# Patient Record
Sex: Male | Born: 1950
Health system: Southern US, Community
[De-identification: ages and names within clinical notes are randomized; demographics above are authoritative.]

## PROBLEM LIST (undated history)

## (undated) DIAGNOSIS — G20A1 Parkinson's disease without dyskinesia, without mention of fluctuations: Secondary | ICD-10-CM

## (undated) DIAGNOSIS — E785 Hyperlipidemia, unspecified: Secondary | ICD-10-CM

## (undated) DIAGNOSIS — I10 Essential (primary) hypertension: Secondary | ICD-10-CM

## (undated) DIAGNOSIS — N529 Male erectile dysfunction, unspecified: Secondary | ICD-10-CM

## (undated) DIAGNOSIS — N433 Hydrocele, unspecified: Secondary | ICD-10-CM

---

## 1992-08-07 HISTORY — PX: VARICOCELECTOMY: SHX1084

## 1995-08-08 HISTORY — PX: HYDROCELE EXCISION: SHX482

## 2013-04-22 ENCOUNTER — Encounter: Payer: Self-pay | Admitting: Sports Medicine

## 2013-04-22 ENCOUNTER — Ambulatory Visit (INDEPENDENT_AMBULATORY_CARE_PROVIDER_SITE_OTHER): Payer: 59 | Admitting: Sports Medicine

## 2013-04-22 DIAGNOSIS — M25512 Pain in left shoulder: Secondary | ICD-10-CM

## 2013-04-22 DIAGNOSIS — M25519 Pain in unspecified shoulder: Secondary | ICD-10-CM | POA: Insufficient documentation

## 2013-04-22 NOTE — Progress Notes (Signed)
  Subjective:    Patient ID: Cristian Campos, male    DOB: 10/01/1950, 62 y.o.   MRN: [id number]  HPI Mr. Holdren is a 62 year old right hand dominant male who is coming in for left shoulder pain. This pain first started 4 years ago when he went digging with his son, but since then, it has been intermittently bothering him. He now notices it most when he plays golf, or holds something heavy in his left arm. He does not have pain that wakes him up at night. The pain is on the outside part of his upper arm where his deltoid muscle ends, and it only bothers him with certain movements. He has not had any decreased ROM. He takes 400 mg advil before golf, and then his shoulder rarely bothers him. He notices a sharp pain if he abducts and internally rotates his shoulder to hold something heavy. He only has pain with impact if he has a bad golf swing.   Solicitor and works with [employer]   Review of Systems     Objective:   Physical Exam Muscular male in NAD  No scapular winging or other deformity. Bilateral scapula are well developed and symmetric. Left shoulder: No tenderness to palpation along scapular spine, AC joint, along clavicle, or at biceps tendon insertion. Minimal pain with palpation of distal deltoid along anterior aspect. Full ROM with shoulder flexion/extension, abduction, IR/ER. 5/5 strength throughout, pain with internal rotation and abduction. Negative Neer's/Hawkins. Negative empty can. Negative liftoff. Negative speeds. Positive Yergason's test on left. Negative O'Briens.   Ultrasound: Scar tissue along left deltoid. Small amount of inflammation and calcification at biceps tendon. Hypoechoic change  On long and trans view of Biceps tendon.  Rotator cuff looks normal with the exception of a small cyst along the teres minor.       Assessment & Plan:   Left shoulder pain 2/2 to likely previous deltoid tear with resultant scar tissue  Plan: - Home exercises to  strengthen deltoid and biceps - Advil PRN - If no improvement, consider nitro patches

## 2013-04-22 NOTE — Patient Instructions (Addendum)
You are going to do some light deltoid work to improve your shoulder.  The deltoid helps to forward flex, abduct, and extend the shoulder back. It is important to work on all three functions.   Take a 5 lb weight, with arms at 90 degrees, abduct out. Next, forward flex the arm. Then extend the arms back. Do 3 sets of 15.  Remember reps are more important than weight.  Do 3 sets of 15 curls for biceps. Next, take forearm, and externally rotate (Arm rolls). Then, hold the arm out with your palm up, and come straight up.   Work on these for the next 1-2 months. You can work on the right side, but the left side is more important.   If your symptoms are not improved in 2 months, we will bring you back and scan it again. If needed, we can consider further treatment at that time.   You can keep taking advil as needed for pain.

## 2013-04-22 NOTE — Assessment & Plan Note (Signed)
I think we should start this with HEP  Deltoid rehab  Biceps rehab  Reck 2 mos  If not better use NTG

## 2014-05-20 ENCOUNTER — Encounter: Payer: Self-pay | Admitting: Sports Medicine

## 2014-05-20 ENCOUNTER — Ambulatory Visit (INDEPENDENT_AMBULATORY_CARE_PROVIDER_SITE_OTHER): Payer: 59 | Admitting: Sports Medicine

## 2014-05-20 VITALS — BP 132/75 | HR 65 | Ht 69.0 in | Wt 160.0 lb

## 2014-05-20 DIAGNOSIS — G5600 Carpal tunnel syndrome, unspecified upper limb: Secondary | ICD-10-CM | POA: Insufficient documentation

## 2014-05-20 DIAGNOSIS — G5601 Carpal tunnel syndrome, right upper limb: Secondary | ICD-10-CM

## 2014-05-20 MED ORDER — VITAMIN B-6 100 MG PO TABS: 100.0000 mg | ORAL_TABLET | Freq: Every day | ORAL | Status: DC

## 2014-05-20 NOTE — Assessment & Plan Note (Signed)
R nerve caliber 0.13cm2. Fluid around palmaris longus tendon. Thickened trans-palmar ligament. -Continue wrist splinting -Start pre-tx with two Aleve before biking. Make sure well padded glove and handle bars -Start Vitamin B6 100mg  po qd x 6 months -Start squeezing soft ball in warm water once nightly to mobilize palmaris longus tendon fluid. -Follow-up if symptoms persist or worsen.

## 2014-05-20 NOTE — Progress Notes (Signed)
   Subjective:    Patient ID: Cristian MoellerJames Campos, male    DOB: 08-Sep-1950, 63 y.o.   MRN: [id number]  HPI Cristian Campos is a 63 year old right-hand-dominant male who presents with right wrist pain. Onset was several months ago, without any known acute injury. He primarily states that the character of pain is numbness and tingling in the first 3 and sometimes 4 fingers of the right hand. He has tried wearing a wrist brace at night, which has relieved some of the symptoms. He says that the other night he did not have his wrist splint, and he woke up with increasing numbness in the right hand. He denies any elbow or neck pain. He denies any significant weakness associated with this.  Past medical history, social history, medications, and allergies were reviewed and are up to date in the chart.  Review of Systems 7 point review of systems was performed and was otherwise negative unless noted in the history of present illness.     Objective:   Physical Exam BP 132/75  Pulse 65  Ht 5\' 9"  (1.753 m)  Wt 160 lb (72.576 kg)  BMI 23.62 kg/m2 GEN: The patient is well-developed well-nourished male and in no acute distress.  He is awake alert and oriented x3. SKIN: warm and well-perfused, no rash  Neuro: Strength 5/5 globally. Sensation intact throughout. No focal deficits. Vasc: +2 bilateral distal pulses. No edema.  MSK: Full range of motion of bilateral wrists. Full pain-free cervical spine motion. Negative Spurling's. Negative Tinel's at the elbow. No swelling or overlying erythema. Positive Tinel's at the right wrist. Positive Phalen's at the right wrist. He has intact thumb abduction strength and intrinsic hand strength. There is no thenar wasting.  Limited musculoskeletal ultrasound: Long and short axis views were obtained of the right wrist. The left median nerve caliber is 0.09 cm and the right median nerve caliber is 0.13 cm. There appears to be thickening of the Trans-palmar ligament on the right.  There also appears to be a moderate fluid collection surrounding the right palmaris longus tendon.     Assessment & Plan:  Please see problem based assessment and plan in the problem list.

## 2015-04-09 ENCOUNTER — Other Ambulatory Visit: Payer: Self-pay | Admitting: Family Medicine

## 2015-04-09 DIAGNOSIS — N5089 Other specified disorders of the male genital organs: Secondary | ICD-10-CM

## 2015-04-15 ENCOUNTER — Ambulatory Visit
Admission: RE | Admit: 2015-04-15 | Discharge: 2015-04-15 | Disposition: A | Discharge status: Home / Self Care | Payer: 59 | Source: Ambulatory Visit | Attending: Family Medicine | Admitting: Family Medicine

## 2015-04-15 DIAGNOSIS — N5089 Other specified disorders of the male genital organs: Secondary | ICD-10-CM

## 2015-09-03 ENCOUNTER — Other Ambulatory Visit: Payer: Self-pay | Admitting: Urology

## 2015-11-16 ENCOUNTER — Encounter (HOSPITAL_BASED_OUTPATIENT_CLINIC_OR_DEPARTMENT_OTHER): Payer: Self-pay | Admitting: *Deleted

## 2015-11-16 NOTE — Progress Notes (Signed)
NPO AFTER MN.  ARRIVE AT 0600.  NEEDS ISTAT AND EKG.  WILL TAKE ATENOLOL AM DOS W/ SIPS OF WATER. 

## 2015-11-22 NOTE — Anesthesia Preprocedure Evaluation (Addendum)
Anesthesia Evaluation  Patient identified by MRN, date of birth, ID band Patient awake    Reviewed: Allergy & Precautions, NPO status , Patient's Chart, lab work & pertinent test results, reviewed documented beta blocker date and time   Airway Mallampati: I  TM Distance: >3 FB Neck ROM: Full    Dental  (+) Dental Advisory Given   Pulmonary neg pulmonary ROS,    breath sounds clear to auscultation       Cardiovascular hypertension, Pt. on medications and Pt. on home beta blockers  Rhythm:Regular Rate:Normal     Neuro/Psych negative neurological ROS     GI/Hepatic negative GI ROS, Neg liver ROS,   Endo/Other  negative endocrine ROS  Renal/GU negative Renal ROS     Musculoskeletal   Abdominal   Peds  Hematology negative hematology ROS (+)   Anesthesia Other Findings   Reproductive/Obstetrics                            Lab Results  Component Value Date   HGB 15.3 11/23/2015   HCT 45.0 11/23/2015   Lab Results  Component Value Date   NA 141 11/23/2015   K 3.7 11/23/2015    Anesthesia Physical Anesthesia Plan  ASA: II  Anesthesia Plan: General   Post-op Pain Management:    Induction: Intravenous  Airway Management Planned: LMA  Additional Equipment:   Intra-op Plan:   Post-operative Plan: Extubation in OR  Informed Consent: I have reviewed the patients History and Physical, chart, labs and discussed the procedure including the risks, benefits and alternatives for the proposed anesthesia with the patient or authorized representative who has indicated his/her understanding and acceptance.     Plan Discussed with: CRNA  Anesthesia Plan Comments:        Anesthesia Quick Evaluation

## 2015-11-23 ENCOUNTER — Encounter (HOSPITAL_BASED_OUTPATIENT_CLINIC_OR_DEPARTMENT_OTHER): Payer: Self-pay | Admitting: *Deleted

## 2015-11-23 ENCOUNTER — Ambulatory Visit (HOSPITAL_BASED_OUTPATIENT_CLINIC_OR_DEPARTMENT_OTHER)
Admission: RE | Admit: 2015-11-23 | Discharge: 2015-11-23 | Disposition: A | Discharge status: Home / Self Care | Payer: 59 | Source: Ambulatory Visit | Attending: Urology | Admitting: Urology

## 2015-11-23 ENCOUNTER — Encounter (HOSPITAL_BASED_OUTPATIENT_CLINIC_OR_DEPARTMENT_OTHER)
Admission: RE | Disposition: A | Discharge status: Home / Self Care | Payer: Self-pay | Source: Ambulatory Visit | Attending: Urology

## 2015-11-23 ENCOUNTER — Ambulatory Visit (HOSPITAL_BASED_OUTPATIENT_CLINIC_OR_DEPARTMENT_OTHER): Payer: 59 | Admitting: Anesthesiology

## 2015-11-23 DIAGNOSIS — L723 Sebaceous cyst: Secondary | ICD-10-CM | POA: Insufficient documentation

## 2015-11-23 DIAGNOSIS — N433 Hydrocele, unspecified: Secondary | ICD-10-CM | POA: Diagnosis not present

## 2015-11-23 DIAGNOSIS — Z7982 Long term (current) use of aspirin: Secondary | ICD-10-CM | POA: Diagnosis not present

## 2015-11-23 DIAGNOSIS — Z79899 Other long term (current) drug therapy: Secondary | ICD-10-CM | POA: Insufficient documentation

## 2015-11-23 DIAGNOSIS — I10 Essential (primary) hypertension: Secondary | ICD-10-CM | POA: Insufficient documentation

## 2015-11-23 DIAGNOSIS — E785 Hyperlipidemia, unspecified: Secondary | ICD-10-CM | POA: Diagnosis not present

## 2015-11-23 HISTORY — DX: Male erectile dysfunction, unspecified: N52.9

## 2015-11-23 HISTORY — PX: HYDROCELE EXCISION: SHX482

## 2015-11-23 HISTORY — DX: Hyperlipidemia, unspecified: E78.5

## 2015-11-23 HISTORY — DX: Hydrocele, unspecified: N43.3

## 2015-11-23 HISTORY — DX: Essential (primary) hypertension: I10

## 2015-11-23 LAB — POCT I-STAT 4, (NA,K, GLUC, HGB,HCT)
GLUCOSE: 102 mg/dL — AB (ref 65–99)
HEMATOCRIT: 45 % (ref 39.0–52.0)
Hemoglobin: 15.3 g/dL (ref 13.0–17.0)
POTASSIUM: 3.7 mmol/L (ref 3.5–5.1)
SODIUM: 141 mmol/L (ref 135–145)

## 2015-11-23 SURGERY — HYDROCELECTOMY
Anesthesia: General | Site: Scrotum | Laterality: Right

## 2015-11-23 MED ORDER — PROPOFOL 10 MG/ML IV BOLUS
INTRAVENOUS | Status: DC | PRN
  Administered 2015-11-23: 200 mg via INTRAVENOUS

## 2015-11-23 MED ORDER — DEXAMETHASONE SODIUM PHOSPHATE 4 MG/ML IJ SOLN
INTRAMUSCULAR | Status: DC | PRN
  Administered 2015-11-23: 10 mg via INTRAVENOUS

## 2015-11-23 MED ORDER — PROPOFOL 10 MG/ML IV BOLUS
INTRAVENOUS | Status: AC
  Filled 2015-11-23: qty 40

## 2015-11-23 MED ORDER — CEFAZOLIN SODIUM-DEXTROSE 2-4 GM/100ML-% IV SOLN
2.0000 g | INTRAVENOUS | Status: AC
  Administered 2015-11-23: 2 g via INTRAVENOUS
  Filled 2015-11-23: qty 100

## 2015-11-23 MED ORDER — LIDOCAINE HCL (CARDIAC) 20 MG/ML IV SOLN
INTRAVENOUS | Status: AC
  Filled 2015-11-23: qty 5

## 2015-11-23 MED ORDER — DEXAMETHASONE SODIUM PHOSPHATE 10 MG/ML IJ SOLN
INTRAMUSCULAR | Status: AC
  Filled 2015-11-23: qty 1

## 2015-11-23 MED ORDER — KETOROLAC TROMETHAMINE 30 MG/ML IJ SOLN
30.0000 mg | Freq: Once | INTRAMUSCULAR | Status: DC
  Filled 2015-11-23: qty 1

## 2015-11-23 MED ORDER — MIDAZOLAM HCL 2 MG/2ML IJ SOLN
INTRAMUSCULAR | Status: AC
  Filled 2015-11-23: qty 2

## 2015-11-23 MED ORDER — HYDROCODONE-ACETAMINOPHEN 7.5-325 MG PO TABS
1.0000 | ORAL_TABLET | Freq: Once | ORAL | Status: AC | PRN
  Administered 2015-11-23: 1 via ORAL
  Filled 2015-11-23: qty 1

## 2015-11-23 MED ORDER — PROMETHAZINE HCL 25 MG/ML IJ SOLN
6.2500 mg | INTRAMUSCULAR | Status: DC | PRN
Start: 2015-11-23 — End: 2015-11-23
  Filled 2015-11-23: qty 1

## 2015-11-23 MED ORDER — KETOROLAC TROMETHAMINE 30 MG/ML IJ SOLN
INTRAMUSCULAR | Status: AC
  Filled 2015-11-23: qty 1

## 2015-11-23 MED ORDER — LIDOCAINE HCL (CARDIAC) 20 MG/ML IV SOLN
INTRAVENOUS | Status: DC | PRN
  Administered 2015-11-23: 80 mg via INTRAVENOUS

## 2015-11-23 MED ORDER — ONDANSETRON HCL 4 MG/2ML IJ SOLN
INTRAMUSCULAR | Status: DC | PRN
  Administered 2015-11-23: 4 mg via INTRAVENOUS

## 2015-11-23 MED ORDER — ONDANSETRON HCL 4 MG/2ML IJ SOLN
INTRAMUSCULAR | Status: AC
  Filled 2015-11-23: qty 2

## 2015-11-23 MED ORDER — FENTANYL CITRATE (PF) 100 MCG/2ML IJ SOLN
INTRAMUSCULAR | Status: DC | PRN
  Administered 2015-11-23: 50 ug via INTRAVENOUS

## 2015-11-23 MED ORDER — LIDOCAINE-EPINEPHRINE (PF) 1 %-1:200000 IJ SOLN
INTRAMUSCULAR | Status: DC | PRN
  Administered 2015-11-23: 2.5 mL

## 2015-11-23 MED ORDER — CEFAZOLIN SODIUM-DEXTROSE 2-4 GM/100ML-% IV SOLN
INTRAVENOUS | Status: AC
  Filled 2015-11-23: qty 100

## 2015-11-23 MED ORDER — MIDAZOLAM HCL 5 MG/5ML IJ SOLN
INTRAMUSCULAR | Status: DC | PRN
  Administered 2015-11-23: 2 mg via INTRAVENOUS

## 2015-11-23 MED ORDER — HYDROCODONE-ACETAMINOPHEN 7.5-325 MG PO TABS
ORAL_TABLET | ORAL | Status: AC
  Filled 2015-11-23: qty 1

## 2015-11-23 MED ORDER — HYDROMORPHONE HCL 1 MG/ML IJ SOLN
0.2500 mg | INTRAMUSCULAR | Status: DC | PRN
  Filled 2015-11-23: qty 1

## 2015-11-23 MED ORDER — FENTANYL CITRATE (PF) 100 MCG/2ML IJ SOLN
INTRAMUSCULAR | Status: AC
  Filled 2015-11-23: qty 2

## 2015-11-23 MED ORDER — DEXTROSE 5 % IV SOLN
1.0000 g | INTRAVENOUS | Status: DC
  Filled 2015-11-23: qty 10

## 2015-11-23 MED ORDER — OXYCODONE-ACETAMINOPHEN 5-325 MG PO TABS: 1.0000 | ORAL_TABLET | Freq: Four times a day (QID) | ORAL | Status: DC | PRN

## 2015-11-23 MED ORDER — BUPIVACAINE-EPINEPHRINE 0.5% -1:200000 IJ SOLN
INTRAMUSCULAR | Status: DC | PRN
  Administered 2015-11-23: 2.5 mL

## 2015-11-23 MED ORDER — LACTATED RINGERS IV SOLN
INTRAVENOUS | Status: DC
  Administered 2015-11-23: 07:00:00 via INTRAVENOUS
  Filled 2015-11-23: qty 1000

## 2015-11-23 SURGICAL SUPPLY — 42 items
BLADE SURG 15 STRL LF DISP TIS (BLADE) ×1 IMPLANT
BLADE SURG 15 STRL SS (BLADE) ×2
BNDG GAUZE ELAST 4 BULKY (GAUZE/BANDAGES/DRESSINGS) ×3 IMPLANT
COVER BACK TABLE 60X90IN (DRAPES) ×3 IMPLANT
COVER MAYO STAND STRL (DRAPES) ×3 IMPLANT
DRAIN PENROSE 18X1/4 LTX STRL (WOUND CARE) IMPLANT
DRAPE LAPAROTOMY 100X72 PEDS (DRAPES) ×3 IMPLANT
DRSG TELFA 3X8 NADH (GAUZE/BANDAGES/DRESSINGS) ×3 IMPLANT
ELECT REM PT RETURN 9FT ADLT (ELECTROSURGICAL) ×3
ELECTRODE REM PT RTRN 9FT ADLT (ELECTROSURGICAL) ×1 IMPLANT
GAUZE SPONGE 4X4 16PLY XRAY LF (GAUZE/BANDAGES/DRESSINGS) ×3 IMPLANT
GLOVE BIOGEL M STRL SZ7.5 (GLOVE) ×3 IMPLANT
GLOVE BIOGEL PI IND STRL 7.0 (GLOVE) ×1 IMPLANT
GLOVE BIOGEL PI IND STRL 7.5 (GLOVE) ×2 IMPLANT
GLOVE BIOGEL PI INDICATOR 7.0 (GLOVE) ×2
GLOVE BIOGEL PI INDICATOR 7.5 (GLOVE) ×4
GLOVE SURG SS PI 7.5 STRL IVOR (GLOVE) ×9 IMPLANT
GOWN STRL REUS W/ TWL XL LVL3 (GOWN DISPOSABLE) ×2 IMPLANT
GOWN STRL REUS W/TWL XL LVL3 (GOWN DISPOSABLE) ×4
KIT ROOM TURNOVER WOR (KITS) ×3 IMPLANT
MANIFOLD NEPTUNE II (INSTRUMENTS) IMPLANT
NEEDLE HYPO 22GX1.5 SAFETY (NEEDLE) ×3 IMPLANT
NS IRRIG 500ML POUR BTL (IV SOLUTION) ×3 IMPLANT
PACK BASIN DAY SURGERY FS (CUSTOM PROCEDURE TRAY) ×3 IMPLANT
PENCIL BUTTON HOLSTER BLD 10FT (ELECTRODE) ×3 IMPLANT
SPONGE LAP 4X18 X RAY DECT (DISPOSABLE) IMPLANT
SUPPORT SCROTAL LG STRP (MISCELLANEOUS) IMPLANT
SUPPORT SCROTAL MED ADLT STRP (MISCELLANEOUS) IMPLANT
SUPPORTER ATHLETIC LG (MISCELLANEOUS)
SUPPORTER ATHLETIC MED (MISCELLANEOUS)
SUT CHROMIC 3 0 PS 2 (SUTURE) ×3 IMPLANT
SUT CHROMIC 3 0 SH 27 (SUTURE) IMPLANT
SUT PDS AB 4-0 RB1 27 (SUTURE) IMPLANT
SUT VIC AB 2-0 SH 27 (SUTURE) ×4
SUT VIC AB 2-0 SH 27XBRD (SUTURE) ×2 IMPLANT
SYR BULB IRRIGATION 50ML (SYRINGE) ×3 IMPLANT
SYR CONTROL 10ML LL (SYRINGE) ×3 IMPLANT
TRAY DSU PREP LF (CUSTOM PROCEDURE TRAY) ×3 IMPLANT
TUBE CONNECTING 12'X1/4 (SUCTIONS) ×1
TUBE CONNECTING 12X1/4 (SUCTIONS) ×2 IMPLANT
WATER STERILE IRR 500ML POUR (IV SOLUTION) IMPLANT
YANKAUER SUCT BULB TIP NO VENT (SUCTIONS) ×3 IMPLANT

## 2015-11-23 NOTE — Transfer of Care (Signed)
Immediate Anesthesia Transfer of Care Note  Patient: Cristian MoellerJames Delamar  Procedure(s) Performed: Procedure(s): HYDROCELECTOMY ADULT, EXCISION OF SEBACIOUS CYST FROM RIGHT SCROTUM (Right)  Patient Location: PACU  Anesthesia Type:General  Level of Consciousness: sedated and responds to stimulation  Airway & Oxygen Therapy: Patient Spontanous Breathing and Patient connected to nasal cannula oxygen  Post-op Assessment: Report given to RN  Post vital signs: Reviewed and stable  Last Vitals:110/79, 57, 13, 98% Filed Vitals:   11/23/15 0609  BP: 125/74  Pulse: 57  Temp: 36.5 C  Resp: 16    Complications: No apparent anesthesia complications

## 2015-11-23 NOTE — Anesthesia Procedure Notes (Signed)
Procedure Name: LMA Insertion Date/Time: 11/23/2015 7:40 AM Performed by: Maris BergerENENNY, Elvie Palomo T Pre-anesthesia Checklist: Patient identified, Emergency Drugs available, Suction available and Patient being monitored Patient Re-evaluated:Patient Re-evaluated prior to inductionOxygen Delivery Method: Circle System Utilized Preoxygenation: Pre-oxygenation with 100% oxygen Intubation Type: IV induction Ventilation: Mask ventilation without difficulty LMA: LMA inserted LMA Size: 5.0 Number of attempts: 1 Airway Equipment and Method: Bite block Placement Confirmation: positive ETCO2 Dental Injury: Teeth and Oropharynx as per pre-operative assessment

## 2015-11-23 NOTE — H&P (Signed)
H&P  Chief Complaint: right hydrocele  History of Present Illness: 65 yo with symptomatic right hydrocele. He presents for right hydrocelectomy. He has been well. No complaints.   Past Medical History  Diagnosis Date  . Hypertension   . Hyperlipidemia   . ED (erectile dysfunction)   . Right hydrocele    Past Surgical History  Procedure Laterality Date  . Varicocelectomy  1994  . Hydrocele excision Left 1997    Home Medications:  Prescriptions prior to admission  Medication Sig Dispense Refill Last Dose  . aspirin EC 81 MG tablet Take 81 mg by mouth daily.   11/12/2015  . atenolol (TENORMIN) 50 MG tablet Take 25 mg by mouth every morning.    11/23/2015 at 0520  . Multiple Vitamin (MULTIVITAMIN) tablet Take 1 tablet by mouth daily.     . Omega-3 Fatty Acids (FISH OIL) 1000 MG CAPS Take 1 capsule by mouth 2 (two) times daily.   11/09/2015  . simvastatin (ZOCOR) 40 MG tablet Take 0.25 tablets by mouth every evening.    11/22/2015 at Unknown time  . triamterene-hydrochlorothiazide (MAXZIDE-25) 37.5-25 MG per tablet Take 1 tablet by mouth every morning.    11/22/2015 at Unknown time  . VIAGRA 100 MG tablet Take 100 mg by mouth as needed.    Past Week at Unknown time   Allergies: No Known Allergies  History reviewed. No pertinent family history. Social History:  reports that he has never smoked. He has never used smokeless tobacco. He reports that he drinks alcohol. He reports that he does not use illicit drugs.  ROS: A complete review of systems was performed.  All systems are negative except for pertinent findings as noted. Review of Systems  All other systems reviewed and are negative.    Physical Exam:  Vital signs in last 24 hours: Temp:  [97.7 F (36.5 C)] 97.7 F (36.5 C) (04/18 0609) Pulse Rate:  [57] 57 (04/18 0609) Resp:  [16] 16 (04/18 0609) BP: (125)/(74) 125/74 mmHg (04/18 0609) SpO2:  [100 %] 100 % (04/18 0609) Weight:  [73.483 kg (162 lb)] 73.483 kg (162 lb)  (04/18 40980609) General:  Alert and oriented, No acute distress HEENT: Normocephalic, atraumatic Lungs: Regular rate and effort Extremities: No edema Neurologic: Grossly intact  Laboratory Data:  Results for orders placed or performed during the hospital encounter of 11/23/15 (from the past 24 hour(s))  I-STAT 4, (NA,K, GLUC, HGB,HCT)     Status: Abnormal   Collection Time: 11/23/15  6:36 AM  Result Value Ref Range   Sodium 141 135 - 145 mmol/L   Potassium 3.7 3.5 - 5.1 mmol/L   Glucose, Bld 102 (H) 65 - 99 mg/dL   HCT 11.945.0 14.739.0 - 82.952.0 %   Hemoglobin 15.3 13.0 - 17.0 g/dL   No results found for this or any previous visit (from the past 240 hour(s)). Creatinine: No results for input(s): CREATININE in the last 168 hours.  Impression/Assessment:  Right hydrocelectomy  Plan:  I discussed with the patient the nature, potential benefits, risks and alternatives to right hydrocelectomy, including side effects of the proposed treatment, the likelihood of the patient achieving the goals of the procedure, and any potential problems that might occur during the procedure or recuperation. All questions answered. Patient elects to proceed.    Cristian Campos 11/23/2015, 7:33 AM

## 2015-11-23 NOTE — Discharge Instructions (Signed)
Hydrocelectomy, Care After Refer to this sheet in the next few weeks. These instructions provide you with information about caring for yourself after your procedure. Your health care provider may also give you more specific instructions. Your treatment has been planned according to current medical practices, but problems sometimes occur. Call your health care provider if you have any problems or questions after your procedure. WHAT TO EXPECT AFTER THE PROCEDURE After your procedure, it is common for the pouch that holds your testicles (scrotum) to be painful, swollen, and bruised. HOME CARE INSTRUCTIONS Bathing  Ask your health care provider when you can shower, take baths, or go swimming.  If you were told to wear an athletic support strap, take it off when you shower or take a bath. Incision Care  Follow instructions from your health care provider about how to take care of your incision. Make sure you:  Wash your hands with soap and water before you change your bandage (dressing). If soap and water are not available, use hand sanitizer.  Change your dressing as told by your health care provider.  Leave stitches (sutures) in place.  Check your incision and scrotum every day for signs of infection. Check for:  More redness, swelling, or pain.  Blood or fluid.  Warmth.  Pus or a bad smell. Managing Pain, Stiffness, and Swelling  If directed, apply ice to the injured area:  Put ice in a plastic bag.  Place a towel between your skin and the bag.  Leave the ice on for 20 minutes, 2-3 times per day. Driving  Do not drive for 24 hours if you received a sedative.  Do not drive or operate heavy machinery while taking prescription pain medicine.  Ask your health care provider when it is safe to drive. Activity  Do not do any activities that require great strength and energy (are vigorous) for as long as told by your health care provider.  Return to your normal activities as  told by your health care provider. Ask your health care provider what activities are safe for you.  Do not lift anything that is heavier than 10 lb (4.5 kg) until your health care provider says that it is safe. General Instructions  Take over-the-counter and prescription medicines only as told by your health care provider.  Keep all follow-up visits as told by your health care provider. This is important.  If you were given an athletic support strap, wear it as told by your health care provider.  If you had a drain put in during the procedure, you will need to return to have it removed. SEEK MEDICAL CARE IF:  Your pain gets worse.  You have more redness, swelling, or pain around your scrotum.  You have blood or fluid coming from your scrotum.  Your incision feels warm to the touch.  You have pus or a bad smell coming from your scrotum.  You have a fever.   This information is not intended to replace advice given to you by your health care provider. Make sure you discuss any questions you have with your health care provider.   Document Released: 04/14/2015 Document Reviewed: 01/20/2015 Elsevier Interactive Patient Education 2016 [employer] Inc[employer]     Incision Care  An incision (cut) is when a surgeon cuts into your body. After surgery, the cut needs to be well cared for to keep it from getting infected.  HOW TO CARE FOR YOUR CUT  Do not take baths, swim, or use a hot  tub until your doctor says it is okay. You may shower as told by your doctor.  ICE area 30 minutes on and 30 minutes off throughout the rest of the day.  Return to your normal diet and activities as allowed by your doctor.  Use medicine that helps lessen itching on your cut as told by your doctor. Do not pick or scratch at your cut.  Drink enough fluids to keep your pee (urine) clear or pale yellow. GET HELP IF:  You have redness, puffiness (swelling), or pain at the site of your cut.  You have fluid,  blood, or pus coming from your cut.  Your muscles ache.  You have chills or you feel sick.  You have a bad smell coming from the cut or bandage.  Your cut opens up after stitches, staples, or adhesive strips have been removed.  You keep feeling sick to your stomach (nauseous) or keep throwing up (vomiting).  You have a fever.  You are dizzy. GET HELP RIGHT AWAY IF:  You have a rash.  You pass out (faint).  You have trouble breathing. MAKE SURE YOU:   Understand these instructions.  Will watch your condition.  Will get help right away if you are not doing well or get worse.   This information is not intended to replace advice given to you by your health care provider. Make sure you discuss any questions you have with your health care provider.   Document Released: 10/16/2011 Document Revised: 08/14/2014 Document Reviewed: 09/17/2013 Elsevier Interactive Patient Education 2016 [employer]      Post Anesthesia Home Care Instructions  Activity: Get plenty of rest for the remainder of the day. A responsible adult should stay with you for 24 hours following the procedure.  For the next 24 hours, DO NOT: -Drive a car -Advertising copywriterperate machinery -Drink alcoholic beverages -Take any medication unless instructed by your physician -Make any legal decisions or sign important papers.  Meals: Start with liquid foods such as gelatin or soup. Progress to regular foods as tolerated. Avoid greasy, spicy, heavy foods. If nausea and/or vomiting occur, drink only clear liquids until the nausea and/or vomiting subsides. Call your physician if vomiting continues.  Special Instructions/Symptoms: Your throat may feel dry or sore from the anesthesia or the breathing tube placed in your throat during surgery. If this causes discomfort, gargle with warm salt water. The discomfort should disappear within 24 hours.  If you had a scopolamine patch placed behind your ear for the management of post-  operative nausea and/or vomiting:  1. The medication in the patch is effective for 72 hours, after which it should be removed.  Wrap patch in a tissue and discard in the trash. Wash hands thoroughly with soap and water. 2. You may remove the patch earlier than 72 hours if you experience unpleasant side effects which may include dry mouth, dizziness or visual disturbances. 3. Avoid touching the patch. Wash your hands with soap and water after contact with the patch.

## 2015-11-23 NOTE — Anesthesia Postprocedure Evaluation (Signed)
Anesthesia Post Note  Patient: Marvis MoellerJames Boster  Procedure(s) Performed: Procedure(s) (LRB): HYDROCELECTOMY ADULT, EXCISION OF SEBACIOUS CYST FROM RIGHT SCROTUM (Right)  Patient location during evaluation: PACU Anesthesia Type: General Level of consciousness: awake and alert Pain management: pain level controlled Vital Signs Assessment: post-procedure vital signs reviewed and stable Respiratory status: spontaneous breathing, nonlabored ventilation, respiratory function stable and patient connected to nasal cannula oxygen Cardiovascular status: blood pressure returned to baseline and stable Postop Assessment: no signs of nausea or vomiting Anesthetic complications: no    Last Vitals:  Filed Vitals:   11/23/15 0935 11/23/15 1022  BP: 112/79 120/76  Pulse: 53 52  Temp:  36.4 C  Resp: 15 14    Last Pain:  Filed Vitals:   11/23/15 1024  PainSc: 4                  Kennieth RadFitzgerald, Marlyne Totaro E

## 2015-11-23 NOTE — Op Note (Signed)
Preoperative diagnosis: Right hydrocele Postoperative diagnosis: Right hydrocele, right scrotal sebaceous cyst   Procedure: Right hydrocelectomy, excision of right scrotal sebaceous cyst  Surgeon: Mena GoesEskridge  Anesthesia: Gen.  Indication for procedure: 65 year old with symptomatic right hydrocele.  Findings: 250 mL right hydrocele. Straw-colored fluid. Testicle and epididymis appeared normal. Palpably normal. 8mm right sebaceous cyst about 2 cm inferior and lateral to the edge of the incision which had grown out into a skin tag. Palpably there was a mobile smooth nodule that appeared white under the skin.   Description of procedure: After consent was obtained patient brought to the operating room. After adequate anesthesia the genitalia were prepped and draped in the usual sterile fashion. A timeout was performed to confirm the patient and procedure. Local was injected into the skin and the hemiscrotal incision was made. The darkest fascia was divided and separated from the hydrocele sac quite easily. The tissue planes were pristine. The hydrocele and testicle were then delivered through the incision and the hydrocele sac open. 250 mL of clear straw-colored fluid was drained. The sac was opened superiorly and inferiorly. The sac was noncommunicating. Excess sac was excised on the left side in the right side and it was everted and sewn to itself loosely around the cord with a running 2-0 Vicryl suture. Hemostasis was insured. The testicle was irrigated. It was placed back in the right hemiscrotum without torsion. The dartos fascia was run closed with a 2-0 Vicryl suture and the skin with a running horizontal mattress suture of 3-0 chromic. This sebaceous cyst was grasped in the skin tag and elevated and a scalpel was used to excise it. This left a very small defect in the scrotal skin which is closed with 1 interrupted chromic suture. A Telfa fluffs and athletic supporter replaced after the patient was  cleaned. He was then awakened and taken to recovery room in stable condition.  Complications: None Blood loss: 3 mL Specimens: None Drains: None

## 2015-11-24 ENCOUNTER — Encounter (HOSPITAL_BASED_OUTPATIENT_CLINIC_OR_DEPARTMENT_OTHER): Payer: Self-pay | Admitting: Urology

## 2017-08-23 DIAGNOSIS — H25813 Combined forms of age-related cataract, bilateral: Secondary | ICD-10-CM | POA: Diagnosis not present

## 2018-02-20 DIAGNOSIS — I10 Essential (primary) hypertension: Secondary | ICD-10-CM | POA: Diagnosis not present

## 2018-02-20 DIAGNOSIS — Z125 Encounter for screening for malignant neoplasm of prostate: Secondary | ICD-10-CM | POA: Diagnosis not present

## 2018-02-20 DIAGNOSIS — Z0000 Encounter for general adult medical examination without abnormal findings: Secondary | ICD-10-CM | POA: Diagnosis not present

## 2018-02-20 DIAGNOSIS — E782 Mixed hyperlipidemia: Secondary | ICD-10-CM | POA: Diagnosis not present

## 2018-03-01 DIAGNOSIS — R7301 Impaired fasting glucose: Secondary | ICD-10-CM | POA: Diagnosis not present

## 2018-06-14 DIAGNOSIS — Z23 Encounter for immunization: Secondary | ICD-10-CM | POA: Diagnosis not present

## 2018-08-21 DIAGNOSIS — H25813 Combined forms of age-related cataract, bilateral: Secondary | ICD-10-CM | POA: Diagnosis not present

## 2018-09-17 DIAGNOSIS — Z85828 Personal history of other malignant neoplasm of skin: Secondary | ICD-10-CM | POA: Diagnosis not present

## 2018-09-17 DIAGNOSIS — C44519 Basal cell carcinoma of skin of other part of trunk: Secondary | ICD-10-CM | POA: Diagnosis not present

## 2018-09-17 DIAGNOSIS — L821 Other seborrheic keratosis: Secondary | ICD-10-CM | POA: Diagnosis not present

## 2018-09-17 DIAGNOSIS — D225 Melanocytic nevi of trunk: Secondary | ICD-10-CM | POA: Diagnosis not present

## 2018-09-17 DIAGNOSIS — C44619 Basal cell carcinoma of skin of left upper limb, including shoulder: Secondary | ICD-10-CM | POA: Diagnosis not present

## 2018-09-17 DIAGNOSIS — D485 Neoplasm of uncertain behavior of skin: Secondary | ICD-10-CM | POA: Diagnosis not present

## 2019-01-03 DIAGNOSIS — E049 Nontoxic goiter, unspecified: Secondary | ICD-10-CM | POA: Diagnosis not present

## 2019-01-06 ENCOUNTER — Other Ambulatory Visit: Payer: Self-pay | Admitting: Family Medicine

## 2019-01-06 DIAGNOSIS — E049 Nontoxic goiter, unspecified: Secondary | ICD-10-CM

## 2019-01-13 ENCOUNTER — Other Ambulatory Visit: Payer: Self-pay

## 2019-01-13 ENCOUNTER — Ambulatory Visit
Admission: RE | Admit: 2019-01-13 | Discharge: 2019-01-13 | Disposition: A | Discharge status: Home / Self Care | Payer: Self-pay | Source: Ambulatory Visit | Attending: Family Medicine | Admitting: Family Medicine

## 2019-01-13 DIAGNOSIS — Z8639 Personal history of other endocrine, nutritional and metabolic disease: Secondary | ICD-10-CM | POA: Diagnosis not present

## 2019-01-13 DIAGNOSIS — E049 Nontoxic goiter, unspecified: Secondary | ICD-10-CM

## 2019-02-26 DIAGNOSIS — I10 Essential (primary) hypertension: Secondary | ICD-10-CM | POA: Diagnosis not present

## 2019-02-26 DIAGNOSIS — Z125 Encounter for screening for malignant neoplasm of prostate: Secondary | ICD-10-CM | POA: Diagnosis not present

## 2019-02-26 DIAGNOSIS — E782 Mixed hyperlipidemia: Secondary | ICD-10-CM | POA: Diagnosis not present

## 2019-02-28 DIAGNOSIS — Z0000 Encounter for general adult medical examination without abnormal findings: Secondary | ICD-10-CM | POA: Diagnosis not present

## 2019-06-24 DIAGNOSIS — M545 Low back pain: Secondary | ICD-10-CM | POA: Diagnosis not present

## 2019-06-24 DIAGNOSIS — M25532 Pain in left wrist: Secondary | ICD-10-CM | POA: Diagnosis not present

## 2019-07-21 DIAGNOSIS — M545 Low back pain: Secondary | ICD-10-CM | POA: Diagnosis not present

## 2019-07-21 DIAGNOSIS — M25532 Pain in left wrist: Secondary | ICD-10-CM | POA: Diagnosis not present

## 2019-07-28 ENCOUNTER — Other Ambulatory Visit: Payer: BC Managed Care – PPO

## 2019-08-11 DIAGNOSIS — Z20828 Contact with and (suspected) exposure to other viral communicable diseases: Secondary | ICD-10-CM | POA: Diagnosis not present

## 2019-09-02 ENCOUNTER — Ambulatory Visit: Payer: BC Managed Care – PPO

## 2019-09-11 ENCOUNTER — Ambulatory Visit: Payer: BC Managed Care – PPO | Attending: Internal Medicine

## 2019-09-15 ENCOUNTER — Ambulatory Visit: Payer: BC Managed Care – PPO | Attending: Internal Medicine

## 2019-09-15 DIAGNOSIS — Z23 Encounter for immunization: Secondary | ICD-10-CM | POA: Insufficient documentation

## 2019-09-15 NOTE — Progress Notes (Signed)
   Covid-19 Vaccination Clinic  Name:  Cristian Campos    MRN: [id number] DOB: [birth year]  09/15/2019  [name] was observed post Covid-19 immunization for 15 minutes without incidence. He was provided with Vaccine Information Sheet and instruction to access the V-Safe system.   [name] was instructed to call 911 with any severe reactions post vaccine: Marland Kitchen Difficulty breathing  . Swelling of your face and throat  . A fast heartbeat  . A bad rash all over your body  . Dizziness and weakness    Immunizations Administered    Name Date Dose VIS Date Route   Pfizer COVID-19 Vaccine 09/15/2019 10:58 AM 0.3 mL 07/18/2019 Intramuscular   Manufacturer: [employer], [employer]   Lot: [id number]   NDC: [address]-4

## 2019-09-19 ENCOUNTER — Ambulatory Visit: Payer: BC Managed Care – PPO

## 2019-09-29 DIAGNOSIS — C44612 Basal cell carcinoma of skin of right upper limb, including shoulder: Secondary | ICD-10-CM | POA: Diagnosis not present

## 2019-09-29 DIAGNOSIS — Z85828 Personal history of other malignant neoplasm of skin: Secondary | ICD-10-CM | POA: Diagnosis not present

## 2019-09-29 DIAGNOSIS — D485 Neoplasm of uncertain behavior of skin: Secondary | ICD-10-CM | POA: Diagnosis not present

## 2019-09-29 DIAGNOSIS — L570 Actinic keratosis: Secondary | ICD-10-CM | POA: Diagnosis not present

## 2019-09-29 DIAGNOSIS — L821 Other seborrheic keratosis: Secondary | ICD-10-CM | POA: Diagnosis not present

## 2019-10-08 DIAGNOSIS — H2513 Age-related nuclear cataract, bilateral: Secondary | ICD-10-CM | POA: Diagnosis not present

## 2019-10-09 ENCOUNTER — Ambulatory Visit: Payer: BC Managed Care – PPO | Attending: Internal Medicine

## 2019-10-09 DIAGNOSIS — Z23 Encounter for immunization: Secondary | ICD-10-CM | POA: Insufficient documentation

## 2019-10-09 NOTE — Progress Notes (Signed)
   Covid-19 Vaccination Clinic  Name:  Cristian Campos    MRN: [id number] DOB: February 27, 1951  10/09/2019  Cristian Campos was observed post Covid-19 immunization for 15 minutes without incident. He was provided with Vaccine Information Sheet and instruction to access the V-Safe system.   Cristian Campos was instructed to call 911 with any severe reactions post vaccine: Marland Kitchen Difficulty breathing  . Swelling of face and throat  . A fast heartbeat  . A bad rash all over body  . Dizziness and weakness   Immunizations Administered    Name Date Dose VIS Date Route   Pfizer COVID-19 Vaccine 10/09/2019  5:55 PM 0.3 mL 07/18/2019 Intramuscular   Manufacturer: [employer], [employer]   Lot: [id number]   NDC: [address]-6

## 2019-12-01 ENCOUNTER — Encounter: Payer: Self-pay | Admitting: Podiatrist

## 2019-12-01 ENCOUNTER — Other Ambulatory Visit: Payer: Self-pay

## 2019-12-01 ENCOUNTER — Ambulatory Visit (INDEPENDENT_AMBULATORY_CARE_PROVIDER_SITE_OTHER): Payer: BC Managed Care – PPO | Admitting: Podiatrist

## 2019-12-01 ENCOUNTER — Ambulatory Visit (INDEPENDENT_AMBULATORY_CARE_PROVIDER_SITE_OTHER): Payer: BC Managed Care – PPO

## 2019-12-01 ENCOUNTER — Other Ambulatory Visit: Payer: Self-pay | Admitting: Podiatrist

## 2019-12-01 VITALS — Temp 96.1°F

## 2019-12-01 DIAGNOSIS — S93602A Unspecified sprain of left foot, initial encounter: Secondary | ICD-10-CM

## 2019-12-01 DIAGNOSIS — M79672 Pain in left foot: Secondary | ICD-10-CM | POA: Diagnosis not present

## 2019-12-01 NOTE — Progress Notes (Signed)
  Chief Complaint  Patient presents with  . Foot Pain    L plantar and dorsal midfoot. Pt stated, "Started after I went skiing in March. Hurts to step on an uneven surface (if something touches the arch). Also hurts if I twist or turn my foot. Pain has gradually improved to 4-5/10. I want to know what I should/shouldn't do".     HPI: Patient is 69 y.o. male who presents today for the concerns as listed above.  Relates he noticed the pain after skiing in march.  He is an avid skiier.  Also relates pain on the  Lateral and mid foot when he twists laterally and in certain positions of a foot twisting motion.    Review of Systems No fevers, chills, nausea, muscle aches, no difficulty breathing, no calf pain, no chest pain or shortness of breath.   Physical Exam  GENERAL APPEARANCE: Alert, conversant. Appropriately groomed. No acute distress.   VASCULAR: Pedal pulses palpable DP and PT bilateral.  Capillary refill time is immediate to all digits,  Proximal to distal cooling it warm to warm.  Digital hair growth is present bilateral   NEUROLOGIC: sensation is intact epicritically and protectively to 5.07 monofilament at 5/5 sites bilateral.  Light touch is intact bilateral, vibratory sensation intact bilateral, achilles tendon reflex is intact bilateral.   MUSCULOSKELETAL: acceptable muscle strength, tone and stability bilateral.  No gross boney pedal deformities noted. Some discomfort near the fourth and fifth metatarsal base region when twisting the foot in a dorsal lateral orientation.  Some puffiness between met heads 2,3 noted as well.   DERMATOLOGIC: skin is warm, supple, and dry.  No open lesions noted.  No rash, no pre ulcerative lesions. Digital nails are asymptomatic.    Radiographic exam:  Normal osseous mineralization.  No fracture or dislocation or acute osseous abnormalities present.  Joint spaces are normal.    Assessment     ICD-10-CM   1. Left foot pain  M79.672 DG Foot  Complete Left  2. Sprain of left foot, initial encounter  [phone]      Plan  Recommended an ankle brace to help limit twisting motion of the foot and to aid in offloading the midfoot during ambulation.  Recommended wearing consistently for the next 4-6 weeks. He will call if symptoms continue past that time period.

## 2019-12-01 NOTE — Patient Instructions (Signed)
Wear the ankle brace when doing any activities on an uneven surfaces like yard work or walking for exercise.  You may wear it when playing golf but it might not be comfortable in your golf shoes.  It should take 4-6 weeks for it to fully improve-  If its been longer than that and it still hurts let me know!

## 2020-01-15 ENCOUNTER — Other Ambulatory Visit: Payer: Self-pay

## 2020-01-15 ENCOUNTER — Ambulatory Visit (HOSPITAL_COMMUNITY)
Admission: EM | Admit: 2020-01-15 | Discharge: 2020-01-15 | Disposition: A | Discharge status: Home / Self Care | Payer: BC Managed Care – PPO | Attending: Family Medicine | Admitting: Family Medicine

## 2020-01-15 ENCOUNTER — Encounter (HOSPITAL_COMMUNITY): Payer: Self-pay

## 2020-01-15 DIAGNOSIS — Z20822 Contact with and (suspected) exposure to covid-19: Secondary | ICD-10-CM

## 2020-01-15 NOTE — ED Triage Notes (Signed)
Pt presents for covid test for travel with no symptoms. 

## 2020-01-15 NOTE — Discharge Instructions (Addendum)
You have been tested for COVID-19 today. °If your test returns positive, you will receive a phone call from [place] regarding your results. °Negative test results are not called. °Both positive and negative results area always visible on MyChart. °If you do not have a MyChart account, sign up instructions are provided in your discharge papers. °Please do not hesitate to contact us should you have questions or concerns. ° °

## 2020-01-15 NOTE — ED Provider Notes (Signed)
[facility]   [id number] 01/15/20 Arrival Time: 1637  ASSESSMENT & PLAN:  1. Encounter for screening laboratory testing for COVID-19 virus in asymptomatic patient      COVID-19 testing sent.   Follow-up Information    Lupita Raider, MD.   Specialty: Family Medicine Why: As needed. Contact information: [address] [place] [address] [phone]               Reviewed expectations re: course of current medical issues. Questions answered. Outlined signs and symptoms indicating need for more acute intervention. Understanding verbalized. After Visit Summary given.   SUBJECTIVE: History from: patient. Corneluis Allston III is a 69 y.o. male who requests COVID-19 testing. Known COVID-19 contact: none. Recent travel: none; needs test for upcoming travel. Without symptoms. Normal PO intake without n/v/d.    OBJECTIVE:  Vitals:   01/15/20 1734  BP: (!) 147/80  Pulse: 60  Resp: 18  Temp: 97.9 F (36.6 C)  TempSrc: Oral  SpO2: 100%    General appearance: alert; no distress HENT: [place]; AT Neck: supple  Lungs: speaks full sentences without difficulty; unlabored Skin: dry Psychological: alert and cooperative; normal mood and affect  Labs:  Labs Reviewed  SARS CORONAVIRUS 2 (TAT 6-24 HRS)     No Known Allergies  Past Medical History:  Diagnosis Date   ED (erectile dysfunction)    Hyperlipidemia    Hypertension    Right hydrocele    Social History   Socioeconomic History   Marital status: Married    Spouse name: Not on file   Number of children: Not on file   Years of education: Not on file   Highest education level: Not on file  Occupational History   Not on file  Tobacco Use   Smoking status: Never Smoker   Smokeless tobacco: Never Used  Substance and Sexual Activity   Alcohol use: Yes    Comment: occasional   Drug use: No   Sexual activity: Not on file  Other Topics Concern   Not on  file  Social History Narrative   Not on file   Social Determinants of Health   Financial Resource Strain:    Difficulty of Paying Living Expenses:   Food Insecurity:    Worried About Programme researcher, broadcasting/film/video in the Last Year:    Barista in the Last Year:   Transportation Needs:    Freight forwarder (Medical):    Lack of Transportation (Non-Medical):   Physical Activity:    Days of Exercise per Week:    Minutes of Exercise per Session:   Stress:    Feeling of Stress :   Social Connections:    Frequency of Communication with Friends and Family:    Frequency of Social Gatherings with Friends and Family:    Attends Religious Services:    Active Member of Clubs or Organizations:    Attends Engineer, structural:    Marital Status:   Intimate Partner Violence:    Fear of Current or Ex-Partner:    Emotionally Abused:    Physically Abused:    Sexually Abused:    Family History  Family history unknown: Yes   Past Surgical History:  Procedure Laterality Date   HYDROCELE EXCISION Left 1997   HYDROCELE EXCISION Right 11/23/2015   Procedure: HYDROCELECTOMY ADULT, EXCISION OF SEBACIOUS CYST FROM RIGHT SCROTUM;  Surgeon: Jerilee Field, MD;  Location: [facility];  Service: Urology;  Laterality:  Right;   VARICOCELECTOMY  1994     Vanessa Kick, MD 01/15/20 1746

## 2020-01-16 LAB — SARS CORONAVIRUS 2 (TAT 6-24 HRS): SARS Coronavirus 2: NEGATIVE

## 2020-02-23 DIAGNOSIS — M79642 Pain in left hand: Secondary | ICD-10-CM | POA: Insufficient documentation

## 2020-02-23 DIAGNOSIS — M13842 Other specified arthritis, left hand: Secondary | ICD-10-CM | POA: Diagnosis not present

## 2020-03-08 DIAGNOSIS — Z23 Encounter for immunization: Secondary | ICD-10-CM | POA: Diagnosis not present

## 2020-03-08 DIAGNOSIS — Z125 Encounter for screening for malignant neoplasm of prostate: Secondary | ICD-10-CM | POA: Diagnosis not present

## 2020-03-08 DIAGNOSIS — Z0000 Encounter for general adult medical examination without abnormal findings: Secondary | ICD-10-CM | POA: Diagnosis not present

## 2020-03-08 DIAGNOSIS — E782 Mixed hyperlipidemia: Secondary | ICD-10-CM | POA: Diagnosis not present

## 2020-03-08 DIAGNOSIS — R7301 Impaired fasting glucose: Secondary | ICD-10-CM | POA: Diagnosis not present

## 2020-03-08 DIAGNOSIS — I10 Essential (primary) hypertension: Secondary | ICD-10-CM | POA: Diagnosis not present

## 2020-10-19 DIAGNOSIS — S76319A Strain of muscle, fascia and tendon of the posterior muscle group at thigh level, unspecified thigh, initial encounter: Secondary | ICD-10-CM | POA: Diagnosis not present

## 2020-10-19 DIAGNOSIS — L821 Other seborrheic keratosis: Secondary | ICD-10-CM | POA: Diagnosis not present

## 2020-10-19 DIAGNOSIS — D485 Neoplasm of uncertain behavior of skin: Secondary | ICD-10-CM | POA: Diagnosis not present

## 2020-10-19 DIAGNOSIS — D225 Melanocytic nevi of trunk: Secondary | ICD-10-CM | POA: Diagnosis not present

## 2020-10-19 DIAGNOSIS — L570 Actinic keratosis: Secondary | ICD-10-CM | POA: Diagnosis not present

## 2020-10-19 DIAGNOSIS — Z85828 Personal history of other malignant neoplasm of skin: Secondary | ICD-10-CM | POA: Diagnosis not present

## 2020-10-27 DIAGNOSIS — G250 Essential tremor: Secondary | ICD-10-CM | POA: Diagnosis not present

## 2020-10-27 DIAGNOSIS — G4700 Insomnia, unspecified: Secondary | ICD-10-CM | POA: Diagnosis not present

## 2020-11-17 DIAGNOSIS — H524 Presbyopia: Secondary | ICD-10-CM | POA: Diagnosis not present

## 2020-11-17 DIAGNOSIS — H43813 Vitreous degeneration, bilateral: Secondary | ICD-10-CM | POA: Diagnosis not present

## 2021-01-19 DIAGNOSIS — G479 Sleep disorder, unspecified: Secondary | ICD-10-CM | POA: Diagnosis not present

## 2021-01-19 DIAGNOSIS — R634 Abnormal weight loss: Secondary | ICD-10-CM | POA: Diagnosis not present

## 2021-01-19 DIAGNOSIS — R251 Tremor, unspecified: Secondary | ICD-10-CM | POA: Diagnosis not present

## 2021-01-25 ENCOUNTER — Other Ambulatory Visit: Payer: Self-pay

## 2021-01-25 ENCOUNTER — Encounter: Payer: Self-pay | Admitting: Neurology

## 2021-01-25 ENCOUNTER — Ambulatory Visit (INDEPENDENT_AMBULATORY_CARE_PROVIDER_SITE_OTHER): Payer: BC Managed Care – PPO | Admitting: Neurology

## 2021-01-25 VITALS — BP 139/90 | HR 62 | Ht 68.0 in | Wt 158.0 lb

## 2021-01-25 DIAGNOSIS — G4752 REM sleep behavior disorder: Secondary | ICD-10-CM | POA: Diagnosis not present

## 2021-01-25 DIAGNOSIS — G4700 Insomnia, unspecified: Secondary | ICD-10-CM | POA: Diagnosis not present

## 2021-01-25 DIAGNOSIS — G479 Sleep disorder, unspecified: Secondary | ICD-10-CM

## 2021-01-25 DIAGNOSIS — G20 Parkinson's disease: Secondary | ICD-10-CM | POA: Diagnosis not present

## 2021-01-25 DIAGNOSIS — I10 Essential (primary) hypertension: Secondary | ICD-10-CM | POA: Diagnosis not present

## 2021-01-25 DIAGNOSIS — R251 Tremor, unspecified: Secondary | ICD-10-CM | POA: Diagnosis not present

## 2021-01-25 DIAGNOSIS — E871 Hypo-osmolality and hyponatremia: Secondary | ICD-10-CM | POA: Diagnosis not present

## 2021-01-25 MED ORDER — ROPINIROLE HCL 0.25 MG PO TABS: 0.7500 mg | ORAL_TABLET | Freq: Three times a day (TID) | ORAL | 3 refills | Status: DC

## 2021-01-25 NOTE — Progress Notes (Signed)
Subjective:    Patient ID: Cristian Campos is a 70 y.o. male.  HPI    Huston Foley, MD, PhD [facility] [address] [address] [place], [place] [address]  Dear Dr. Clelia Croft,   I saw your patient, Cristian Campos, upon your kind request in my neurologic clinic today for initial consultation of his tremor of the left upper and lower extremities.  The patient is accompanied by his wife today.  As you know, Mr. Blank is a 70 year old right-handed gentleman with an underlying medical history of hypertension, hyperlipidemia, ED, carpal tunnel syndrome, shoulder pain, and insomnia, who reports an approximately 2-year history of left-sided tremors.  Tremors started first in the left upper extremity and in the past few months has been notable intermittently in the left lower extremity.  He does not have any right-sided symptoms.  Symptoms have been mildly progressive.  In addition, he has had some changes in his appetite and he has had worsening sleep difficulty.  He has a longer standing history of difficulty maintaining sleep.  He has tried multiple medications in the recent past.  I reviewed your office note from 10/27/2020 and he was tried on gabapentin at the time.  He reports that it did not help and he stopped it after trying up to 300 mg at bedtime.  For his tremor, he was advised to increase his atenolol, he was on 25 mg daily and this was increased on 01/11/1999 22 to 50 mg daily, he reports that it did not affect the tremor.  It did help with his blood pressure though.  He has not had any recent falls but has had some changes in his gait, wife reports that he has had some shuffling in his walk.  He is very active, he likes to work in the yard, he also plays golf, he has noticed some stiffness affecting his left arm and leg.  His wife also endorses that he has had some vivid dreams and had some dream enactment behavior, particularly yelling out in sleep.  He does not  recall any particular dreams or vivid dreams.  He has no family history of tremors or Parkinson's disease.  He is a non-smoker, he lives with his wife, he worked in [employer].  He drinks alcohol in the form of beer, up to 1 or 2/day on average.  Sometimes he drinks scotch in the evening.  For sleep, he has had other medications recently.  He was tried on trazodone which did not help and about a week ago he was tried on Ambien for few days and it helped for the first night but not after that.  He had a follow-up visit with you today and was given a prescription for lorazepam 0.5 mg strength, up to 4 pills at bedtime as I understand.  He has of course not picked up his prescription yet.   His Past Medical History Is Significant For: Past Medical History:  Diagnosis Date   ED (erectile dysfunction)    Hyperlipidemia    Hypertension    Right hydrocele     His Past Surgical History Is Significant For: Past Surgical History:  Procedure Laterality Date   HYDROCELE EXCISION Left 1997   HYDROCELE EXCISION Right 11/23/2015   Procedure: HYDROCELECTOMY ADULT, EXCISION OF SEBACIOUS CYST FROM RIGHT SCROTUM;  Surgeon: Jerilee Field, MD;  Location: [facility];  Service: Urology;  Laterality: Right;   VARICOCELECTOMY  1994    His  Family History Is Significant For: Family History  Family history unknown: Yes    His Social History Is Significant For: Social History   Socioeconomic History   Marital status: Married    Spouse name: Not on file   Number of children: Not on file   Years of education: Not on file   Highest education level: Not on file  Occupational History   Not on file  Tobacco Use   Smoking status: Never   Smokeless tobacco: Never  Substance and Sexual Activity   Alcohol use: Yes    Comment: occasional   Drug use: No   Sexual activity: Not on file  Other Topics Concern   Not on file  Social History Narrative   Not on file   Social  Determinants of Health   Financial Resource Strain: Not on file  Food Insecurity: Not on file  Transportation Needs: Not on file  Physical Activity: Not on file  Stress: Not on file  Social Connections: Not on file    His Allergies Are:  No Known Allergies:   His Current Medications Are:  Outpatient Encounter Medications as of 01/25/2021  Medication Sig   aspirin EC 81 MG tablet Take 81 mg by mouth daily.   atenolol (TENORMIN) 50 MG tablet Take 50 mg by mouth daily.   Multiple Vitamin (MULTIVITAMIN) tablet Take 1 tablet by mouth daily.   simvastatin (ZOCOR) 40 MG tablet Take 0.25 tablets by mouth every evening.    triamterene-hydrochlorothiazide (MAXZIDE-25) 37.5-25 MG per tablet Take 1 tablet by mouth every morning.    VIAGRA 100 MG tablet Take 100 mg by mouth as needed.    [DISCONTINUED] atenolol (TENORMIN) 50 MG tablet Take 25 mg by mouth every morning.    [DISCONTINUED] Omega-3 Fatty Acids (FISH OIL) 1000 MG CAPS Take 1 capsule by mouth 2 (two) times daily.   No facility-administered encounter medications on file as of 01/25/2021.  :   Review of Systems:  Out of a complete 14 point review of systems, all are reviewed and negative with the exception of these symptoms as listed below:  Review of Systems  Neurological:        Here for consult on worsening left hand tremor. Reports hand tremor has been present for a while and has progressed to his left leg.  He also reports trouble sleeping at night due to these sx.   Objective:  Neurological Exam  Physical Exam Physical Examination:   Vitals:   01/25/21 1118  BP: 139/90  Pulse: 62    General Examination: The patient is a very pleasant 70 y.o. male in no acute distress. He appears well-developed and well-nourished and well groomed.   HEENT: Normocephalic, atraumatic, pupils are equal, round and reactive to light, mild cataract noted, more so on the right.  Extraocular tracking is fairly well-preserved, mild upgaze  limitation noted, face is mildly masked appearing.  He has a mild nuchal rigidity, no lip, neck or jaw tremor, voice is mildly hypophonic, almost a little raspy, no dysarthria noted.  Airway examination reveals mild mouth dryness, tongue protrudes centrally and palate elevates symmetrically, no carotid bruits.  Hearing grossly intact.    Chest: Clear to auscultation without wheezing, rhonchi or crackles noted.  Heart: S1+S2+0, regular and normal without murmurs, rubs or gallops noted.   Abdomen: Soft, non-tender and non-distended with normal bowel sounds appreciated on auscultation.  Extremities: There is no pitting edema in the distal lower extremities bilaterally. Pedal pulses are intact.  Skin:  Warm and dry without trophic changes noted.  Mild bruising noted across the forearms bilaterally.  Musculoskeletal: exam reveals no obvious joint deformities.   Neurologically:  Mental status: The patient is awake, alert and oriented in all 4 spheres. His immediate and remote memory, attention, language skills and fund of knowledge are appropriate. There is no evidence of aphasia, agnosia, apraxia or anomia. Speech is clear with normal prosody and enunciation. Thought process is linear. Mood is normal and affect is normal.  Cranial nerves II - XII are as described above under HEENT exam. In addition: shoulder shrug is normal with equal shoulder height noted. Motor exam: Normal bulk and strength is noted, tone is slightly increased in the left upper extremity with no telltale cogwheeling noted.  He has a intermittent resting tremor in the left upper extremity and a slight resting tremor in the left lower extremity also intermittent.  He has no obvious resting tremor in the right upper or right lower extremities.  He has no significant postural or action tremor, no significant intention tremor.  On 01/25/2021: On Archimedes spiral drawing he has no significant trembling with either hand, handwriting with  the right hand is legible, not particularly tremulous but micrographic appearing. Reflexes are 1+ in the upper and lower extremities bilaterally.  On fine motor testing: He has normal findings with finger taps on the right, minimal impairment in the left upper extremity, hand movements are fairly normal bilaterally and rapid alternating patting is fairly normal bilaterally, foot taps are mildly decreased in amplitude in the left lower extremity.  Foot agility normal, finger-to-nose and heel-to-shin are unremarkable bilaterally as far cerebellar testing.  Sensory exam is intact to light touch.   Gait, station and balance: He stands without difficulty, posture is slightly stooped for age, he walks with good stride length and fairly good pace, decreased arm swing on the left noted.  He turns without difficulty, balance is preserved.  Assessment and Plan:   Assessment and Plan:  In summary, Hogan Hoobler Campos is a very pleasant 70 y.o.-year old male with an underlying medical history of hypertension, hyperlipidemia, ED, carpal tunnel syndrome, shoulder pain, and insomnia, who presents for evaluation of his tremor disorder of approximately 2 years duration, started with a left upper extremity tremor.  History and examination are supportive of underlying parkinsonism, likely left-sided predominant, tremor predominant Parkinson's disease, no telltale historical findings concerning for atypical parkinsonism.  He has significant sleep disturbance currently in particular sleep maintenance issues, some concern for underlying dream enactment behavior in keeping with REM behavior disorder but currently not a major problem.  For his sleep difficulty he has tried multiple medications including over-the-counter medication.  He also tried melatonin and I encouraged him to consider trying the melatonin again eventually at a higher dose, up to 20 mg which has been studied in Parkinson's patients.  For now, I would like  for him to proceed with symptomatic treatment in the form of a dopamine agonist, namely Requip generic 0.25 mg strength 1 pill twice daily and with gradual titration to up to 3 pills 3 times daily in the next few weeks.  He is advised not to start his Ativan and the Requip at the same time and delay 1 medication or the other for now so not to have any confusion as to side effects.  We talked about Requip, its expectations, limitations, possible common side effects including rare side effects such as impulse control disorder.  He was given written instructions  with detail about his titration and we also talked about seeking further information and support through Parkinson's support groups and supportive websites such as to [employer]. [employer].  We will also look for additional written information material at the next appointment so he can be more informed.  I asked him to follow-up routinely in this clinic in 3 months, sooner if needed.  We can certainly consider a brain MRI in the near future, for now, we will proceed with symptomatic treatment and continue to monitor his symptoms and examination.  I answered all their questions today and the patient and his wife were in agreement.  This was an extended visit of over 1 hour including precharting and reviewing records.    Thank you very much for allowing me to participate in the care of this nice patient. If I can be of any further assistance to you please do not hesitate to call me at [phone].  Sincerely,   Huston Foley, MD, PhD

## 2021-01-25 NOTE — Patient Instructions (Signed)
I believe you have Parkinson's disease, affecting your left side and findings are on the mild side. Nevertheless, as you know, this disease does progress with time. It can affect your balance, your memory, your mood, your bowel and bladder function, your posture, balance and walking and your activities of daily living. However, there are good supportive treatments and symptomatic treatments available, so most patients have a change to a good quality life and life expectancy is not typically altered. Overall you are doing fairly well but I do want to suggest a few things today  Remember to drink plenty of fluid at least 6 glasses (8 oz each), eat healthy meals and do not skip any meals. Try to eat protein with a every meal and eat a healthy snack such as fruit or nuts in between meals. Try to keep a regular sleep-wake schedule and try to exercise daily, particularly in the form of walking, 20-30 minutes a day, if you can.   As far as medication: I would like for you to start a so-called dopamine agonist by the name of Requip (generic name: ropinirole) 0.25 mg: Take one pill twice daily (morning and lunchtime) for one week, then one pill 3 times a day (morning, lunch and evening) for one week, then 2 pills 3 times a day for one week, then 3 pills three times a day thereafter. Common side effects reported are: Sedation, sleepiness, nausea, vomiting, and rare side effects are confusion, hallucinations, swelling in legs, and abnormal behaviors, including impulse control problems, which can manifest as excessive eating, obsessions with food or gambling, or hypersexuality.  Please remember, no medication is without potential side effects and not every medication is right for every patient with PD.   Try to stay active physically and mentally. Engage in social activities in your community and with your family and try to keep up with current events by reading the newspaper or watching the news. Try to do word puzzles  and you may like to do puzzles and brain games on the computer such as on [email or web address]/.   Please call or email [place] through MyChart for any interim questions or concerns. For any emergencies you know to call 911 or go to the nearest emergency room.

## 2021-01-27 ENCOUNTER — Other Ambulatory Visit: Payer: Self-pay | Admitting: Family Medicine

## 2021-01-27 ENCOUNTER — Ambulatory Visit
Admission: RE | Admit: 2021-01-27 | Discharge: 2021-01-27 | Disposition: A | Discharge status: Home / Self Care | Payer: BC Managed Care – PPO | Source: Ambulatory Visit | Attending: Family Medicine | Admitting: Family Medicine

## 2021-01-27 DIAGNOSIS — R634 Abnormal weight loss: Secondary | ICD-10-CM

## 2021-01-27 DIAGNOSIS — E871 Hypo-osmolality and hyponatremia: Secondary | ICD-10-CM

## 2021-02-01 ENCOUNTER — Encounter: Payer: Self-pay | Admitting: Neurology

## 2021-02-01 ENCOUNTER — Telehealth: Payer: Self-pay | Admitting: Neurology

## 2021-02-01 NOTE — Telephone Encounter (Signed)
Please see my chart message for reference.  Please advise patient to schedule a follow-up appointment with his primary care to discuss prescription medicine for sleep.  For now, taking a small dose of Benadryl such as 25 mg at night to bridge until he can see his primary care may help.

## 2021-02-01 NOTE — Telephone Encounter (Addendum)
See mychart message from 02/01/21. 

## 2021-02-09 ENCOUNTER — Telehealth: Payer: Self-pay | Admitting: Neurology

## 2021-02-09 NOTE — Telephone Encounter (Signed)
Please see my chart message for reference.  If the patient has severe nausea and cannot tolerate the ropinirole, we may have to stop it.  Sometimes the side effects including sleepiness and nausea persist longer but generally tend to get better over time, it can take weeks.

## 2021-02-10 DIAGNOSIS — K5900 Constipation, unspecified: Secondary | ICD-10-CM | POA: Diagnosis not present

## 2021-02-10 DIAGNOSIS — I10 Essential (primary) hypertension: Secondary | ICD-10-CM | POA: Diagnosis not present

## 2021-02-10 DIAGNOSIS — G20 Parkinson's disease: Secondary | ICD-10-CM | POA: Diagnosis not present

## 2021-02-10 DIAGNOSIS — E871 Hypo-osmolality and hyponatremia: Secondary | ICD-10-CM | POA: Diagnosis not present

## 2021-02-10 DIAGNOSIS — G4700 Insomnia, unspecified: Secondary | ICD-10-CM | POA: Diagnosis not present

## 2021-02-10 NOTE — Telephone Encounter (Signed)
Message sent to patient in mychart encounter.

## 2021-03-03 ENCOUNTER — Other Ambulatory Visit: Payer: Self-pay | Admitting: Neurology

## 2021-03-03 ENCOUNTER — Other Ambulatory Visit: Payer: Self-pay

## 2021-03-03 ENCOUNTER — Ambulatory Visit
Admission: RE | Admit: 2021-03-03 | Discharge: 2021-03-03 | Disposition: A | Discharge status: Home / Self Care | Payer: BC Managed Care – PPO | Source: Ambulatory Visit | Attending: Sports Medicine | Admitting: Sports Medicine

## 2021-03-03 ENCOUNTER — Other Ambulatory Visit: Payer: Self-pay | Admitting: Sports Medicine

## 2021-03-03 DIAGNOSIS — M545 Low back pain, unspecified: Secondary | ICD-10-CM

## 2021-03-03 DIAGNOSIS — M5442 Lumbago with sciatica, left side: Secondary | ICD-10-CM | POA: Diagnosis not present

## 2021-03-03 NOTE — Telephone Encounter (Signed)
I reviewed patient's MyChart message.  We can try to switch him to ropinirole once daily long-acting to see if his nausea and sleepiness improve on a slow release formulation of ropinirole.  If he is agreeable, we can go ahead and switch him to ropinirole ER 4 mg once daily.  If he is okay with starting this, please send a prescription to his pharmacy of choice and he can stop taking the immediate release ropinirole as soon as he is able to fill the prescription for the long-acting.

## 2021-03-03 NOTE — Telephone Encounter (Signed)
I called pt and he has questions about this drug and parkinson's, helping his tremor.  SE not so bad, and has question about taking 4mg  ER ropinirole.  I was able to consult with Dr. .  She stated did not want to go lower, felt like the 4mg  was the better alternative relating to his energy and may even help his tremor.  I will contact pt next week.

## 2021-03-07 ENCOUNTER — Other Ambulatory Visit: Payer: Self-pay | Admitting: Neurology

## 2021-03-07 ENCOUNTER — Telehealth: Payer: Self-pay | Admitting: Neurology

## 2021-03-07 MED ORDER — ROPINIROLE HCL ER 4 MG PO TB24: 4.0000 mg | ORAL_TABLET | Freq: Every day | ORAL | 1 refills | Status: DC

## 2021-03-07 NOTE — Addendum Note (Signed)
Addended by: Hermenia Fiscal S on: 03/07/2021 11:13 AM   Modules accepted: Orders

## 2021-03-07 NOTE — Telephone Encounter (Signed)
I called pt and I relayed that per Dr. Frances Furbish that the 4mg  ER tablet may help with SE he is experiencing and also the increased dose my help with the tremor.  He was willing to try.  He questions if medication is not helping tremor if indeed he has PD. I relayed that diagnosis is more clinical  (relating to sx) pt experiencing , sometimes DATSCAN is used per provider if they feel the need to do this.  He verbalized understanding. Ok to proceed with increase and will let know how he is doing in 2 wks.  Confirmed pharmacy.  Order started please sign off.

## 2021-03-07 NOTE — Telephone Encounter (Signed)
I suggested going to the ropinirole ER 4 mg once daily to see if side effects such as sleepiness and nausea would be better with the extended release compared to the immediate release ropinirole.  The reason for going to 4 mg is simply because it only comes in 2 mg and 4 mg and higher.  Reducing the medicine to 2 mg would be a step backwards in my opinion.  However, if he would rather try the 2 mg extended release first, we can send a prescription for the 2 mg extended release ropinirole first.  Please reiterate to patient.

## 2021-03-07 NOTE — Telephone Encounter (Signed)
Message sent to pt via mychart

## 2021-03-09 DIAGNOSIS — M546 Pain in thoracic spine: Secondary | ICD-10-CM | POA: Diagnosis not present

## 2021-03-09 DIAGNOSIS — I10 Essential (primary) hypertension: Secondary | ICD-10-CM | POA: Diagnosis not present

## 2021-03-09 DIAGNOSIS — R7301 Impaired fasting glucose: Secondary | ICD-10-CM | POA: Diagnosis not present

## 2021-03-09 DIAGNOSIS — E782 Mixed hyperlipidemia: Secondary | ICD-10-CM | POA: Diagnosis not present

## 2021-03-09 DIAGNOSIS — Z0000 Encounter for general adult medical examination without abnormal findings: Secondary | ICD-10-CM | POA: Diagnosis not present

## 2021-03-09 DIAGNOSIS — M5451 Vertebrogenic low back pain: Secondary | ICD-10-CM | POA: Diagnosis not present

## 2021-03-09 DIAGNOSIS — Z125 Encounter for screening for malignant neoplasm of prostate: Secondary | ICD-10-CM | POA: Diagnosis not present

## 2021-03-09 DIAGNOSIS — M6281 Muscle weakness (generalized): Secondary | ICD-10-CM | POA: Diagnosis not present

## 2021-03-09 DIAGNOSIS — M25552 Pain in left hip: Secondary | ICD-10-CM | POA: Diagnosis not present

## 2021-03-14 ENCOUNTER — Telehealth: Payer: Self-pay | Admitting: Neurology

## 2021-03-14 NOTE — Telephone Encounter (Signed)
My chart message sent to pt.

## 2021-03-14 NOTE — Telephone Encounter (Signed)
Please see my chart message for reference.  Please advise patient to continue with the long-acting ropinirole 4 mg once daily for about 1 month.  If he is agreeable, for the next refill we can increase the ropinirole before the Rx refill to the next dose which is 6 mg once daily.  Sometimes it takes a higher dose to notice an effect.  He may notice an improvement in the tremor within the first 2 weeks of starting the medication.  If he is agreeable, please change ropinirole ER to 6 mg once daily.

## 2021-03-16 DIAGNOSIS — M546 Pain in thoracic spine: Secondary | ICD-10-CM | POA: Diagnosis not present

## 2021-03-16 DIAGNOSIS — M6281 Muscle weakness (generalized): Secondary | ICD-10-CM | POA: Diagnosis not present

## 2021-03-16 DIAGNOSIS — M5451 Vertebrogenic low back pain: Secondary | ICD-10-CM | POA: Diagnosis not present

## 2021-03-16 DIAGNOSIS — M25552 Pain in left hip: Secondary | ICD-10-CM | POA: Diagnosis not present

## 2021-03-23 DIAGNOSIS — M25552 Pain in left hip: Secondary | ICD-10-CM | POA: Diagnosis not present

## 2021-03-23 DIAGNOSIS — M5451 Vertebrogenic low back pain: Secondary | ICD-10-CM | POA: Diagnosis not present

## 2021-03-23 DIAGNOSIS — M546 Pain in thoracic spine: Secondary | ICD-10-CM | POA: Diagnosis not present

## 2021-03-23 DIAGNOSIS — M6281 Muscle weakness (generalized): Secondary | ICD-10-CM | POA: Diagnosis not present

## 2021-03-24 ENCOUNTER — Ambulatory Visit: Payer: BC Managed Care – PPO | Admitting: Neurology

## 2021-03-28 DIAGNOSIS — M546 Pain in thoracic spine: Secondary | ICD-10-CM | POA: Diagnosis not present

## 2021-03-28 DIAGNOSIS — M25552 Pain in left hip: Secondary | ICD-10-CM | POA: Diagnosis not present

## 2021-03-28 DIAGNOSIS — M5451 Vertebrogenic low back pain: Secondary | ICD-10-CM | POA: Diagnosis not present

## 2021-03-28 DIAGNOSIS — M6281 Muscle weakness (generalized): Secondary | ICD-10-CM | POA: Diagnosis not present

## 2021-03-30 DIAGNOSIS — M25552 Pain in left hip: Secondary | ICD-10-CM | POA: Diagnosis not present

## 2021-03-30 DIAGNOSIS — M6281 Muscle weakness (generalized): Secondary | ICD-10-CM | POA: Diagnosis not present

## 2021-03-30 DIAGNOSIS — M546 Pain in thoracic spine: Secondary | ICD-10-CM | POA: Diagnosis not present

## 2021-03-30 DIAGNOSIS — M5451 Vertebrogenic low back pain: Secondary | ICD-10-CM | POA: Diagnosis not present

## 2021-03-31 ENCOUNTER — Telehealth: Payer: Self-pay | Admitting: Neurology

## 2021-03-31 MED ORDER — ROPINIROLE HCL ER 6 MG PO TB24: 6.0000 mg | ORAL_TABLET | Freq: Every day | ORAL | 3 refills | Status: DC

## 2021-03-31 NOTE — Telephone Encounter (Signed)
Called patient who stated he is willing to try an increase in dose of Requip up to 6 mg. He stated the tremor hasn't changed on 4 mg XL.However no side effects with XL. He has FU on 04/27/21 and asks Dr Frances Furbish to either prescribe 6 mg if she agrees or send refill on 4 mg for one month only. He can then discuss dose increase at FU on 04/27/21.  He asked for my chart message to let him know which dose Dr Frances Furbish sends in. Patient verbalized understanding, appreciation.

## 2021-03-31 NOTE — Telephone Encounter (Signed)
Please see my chart message for reference, I have changed his ropinirole ER from 4 mg daily to 6 mg daily.  Please update patient as well.

## 2021-03-31 NOTE — Telephone Encounter (Signed)
Pt called needing to discuss a dosage change and refill request for his rOPINIRole (REQUIP XL) 4 MG 24 hr tablet. Please advise.

## 2021-04-07 DIAGNOSIS — M545 Low back pain, unspecified: Secondary | ICD-10-CM | POA: Diagnosis not present

## 2021-04-12 IMAGING — US US THYROID
1 series · 14 of 25 positions shown · non-contrast
Comparison: None.

CLINICAL DATA: 67-year-old male with a history of thyroid
enlargement on physical exam

EXAM:
THYROID ULTRASOUND
TECHNIQUE: Ultrasound examination of the thyroid gland and adjacent soft
tissues was performed.

[Series 1: us thyroid · 0.05mm/px · 14 of 43 slices shown]
[im 1/43]
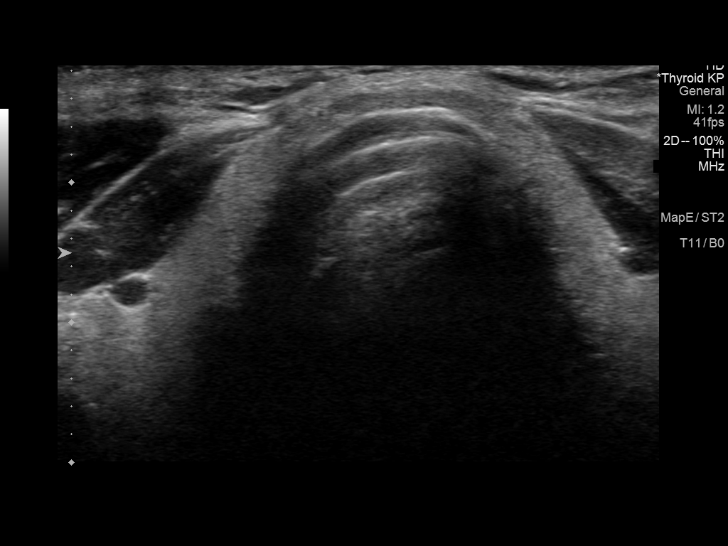
[im 4/43]
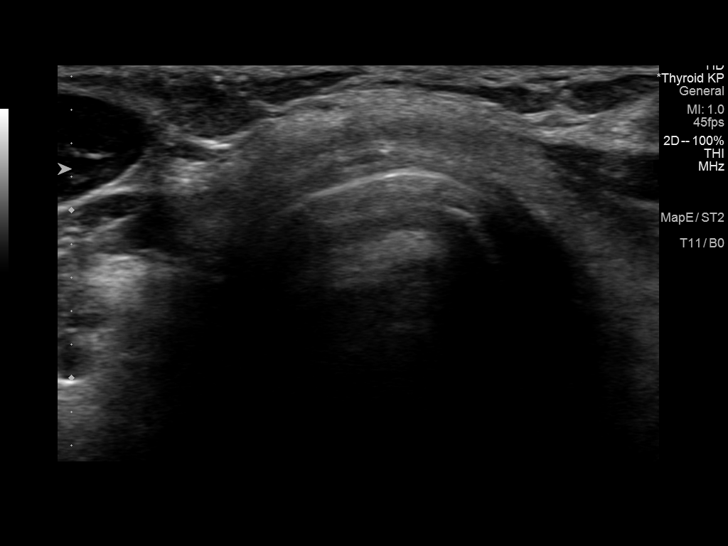
[im 8/43]
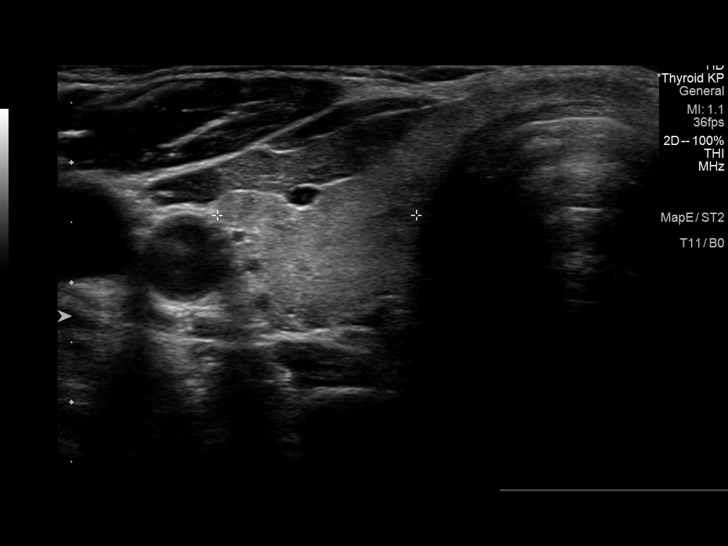
[im 11/43]
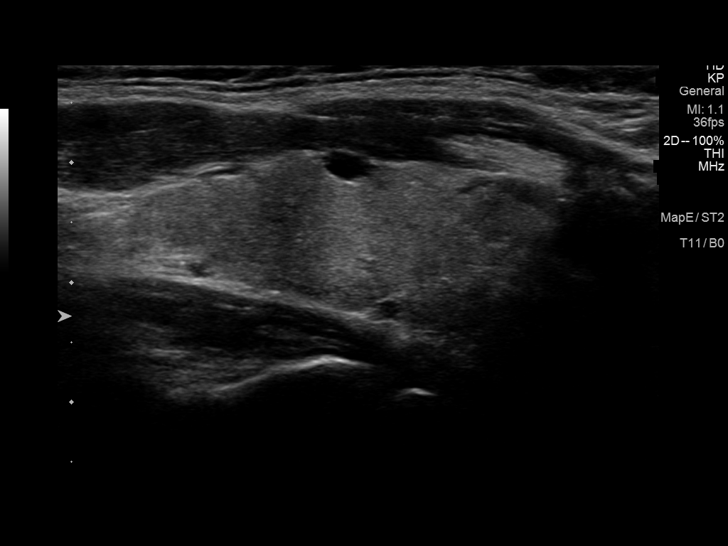
[im 15/43]
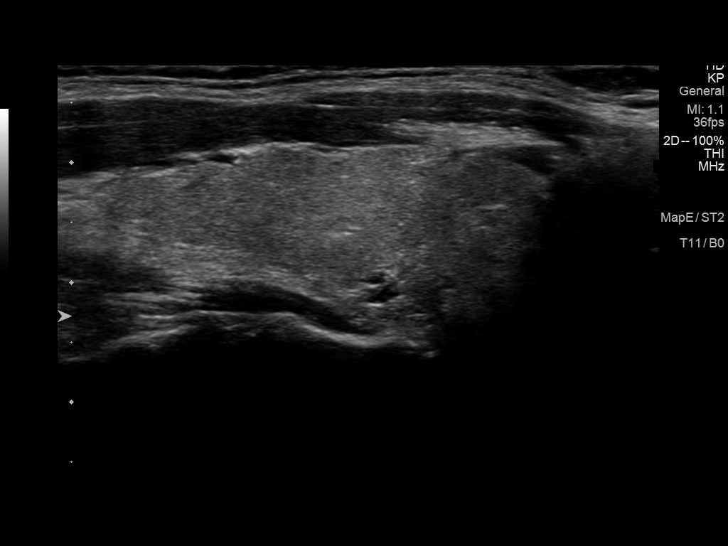
[im 16/43]
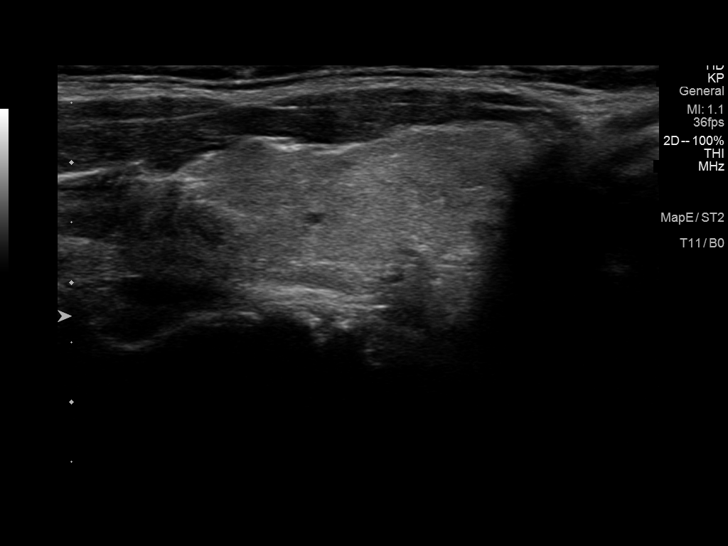
[im 20/43]
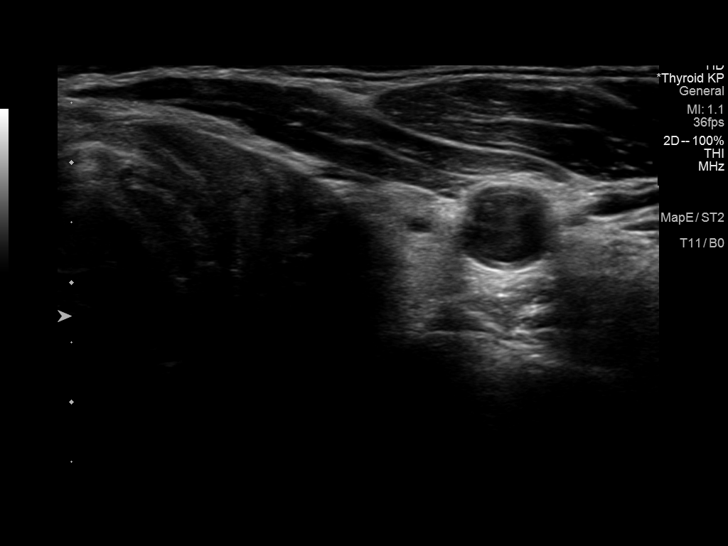
[im 23/43]
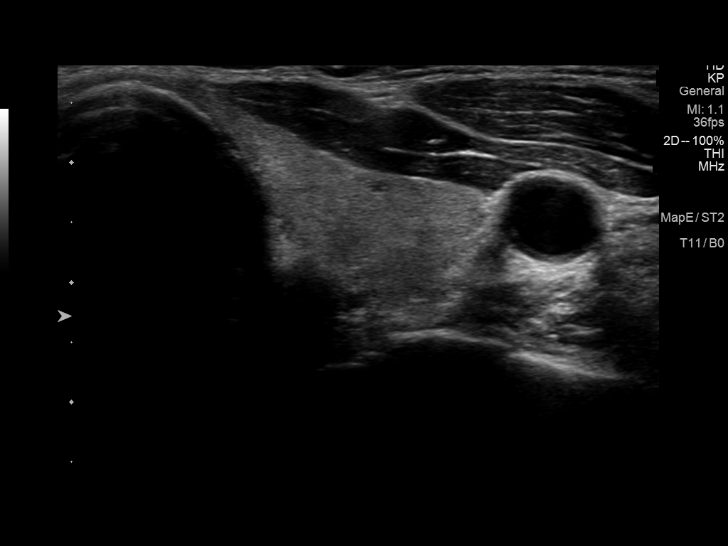
[im 27/43]
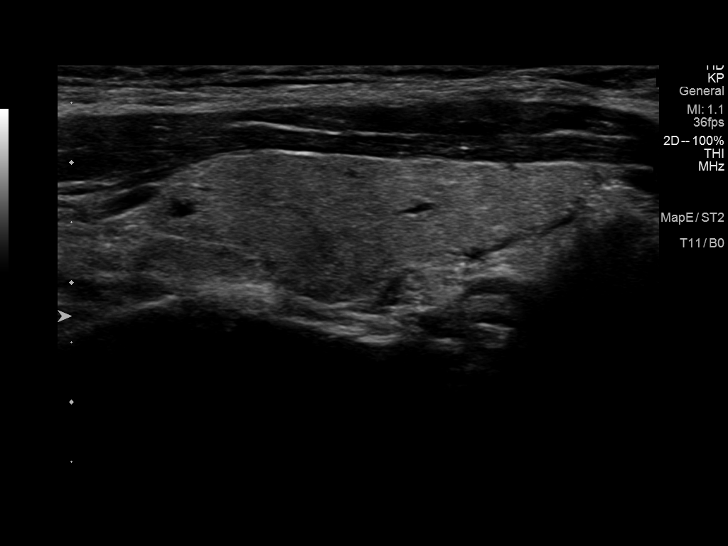
[im 29/43]
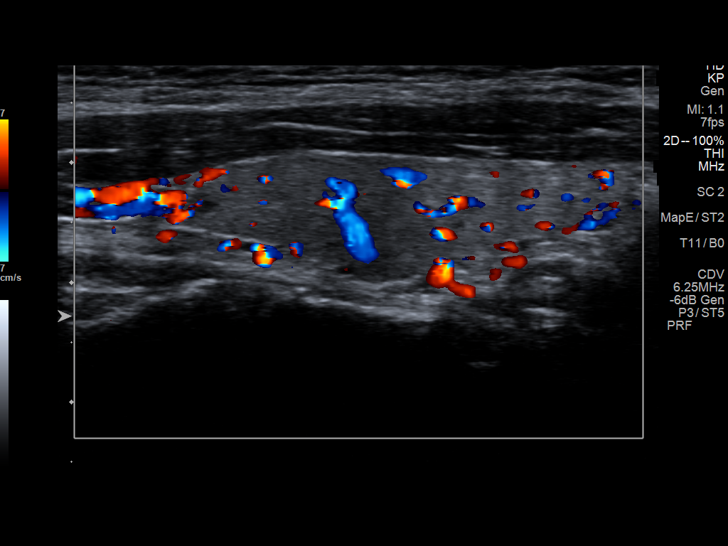
[im 32/43]
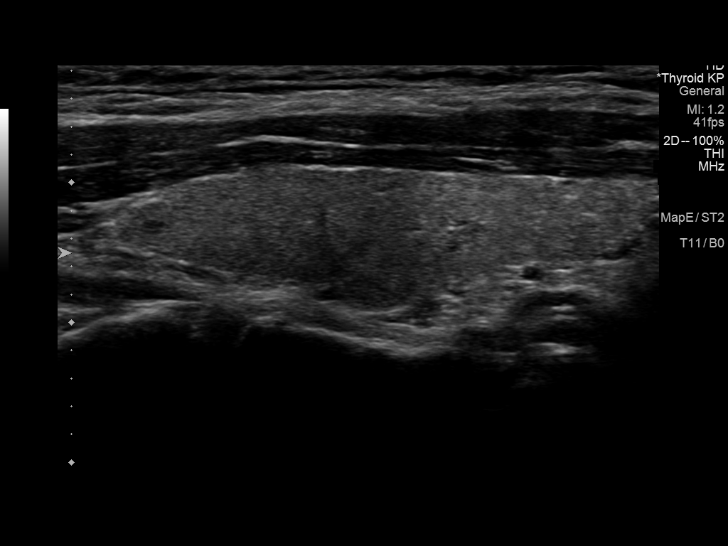
[im 36/43]
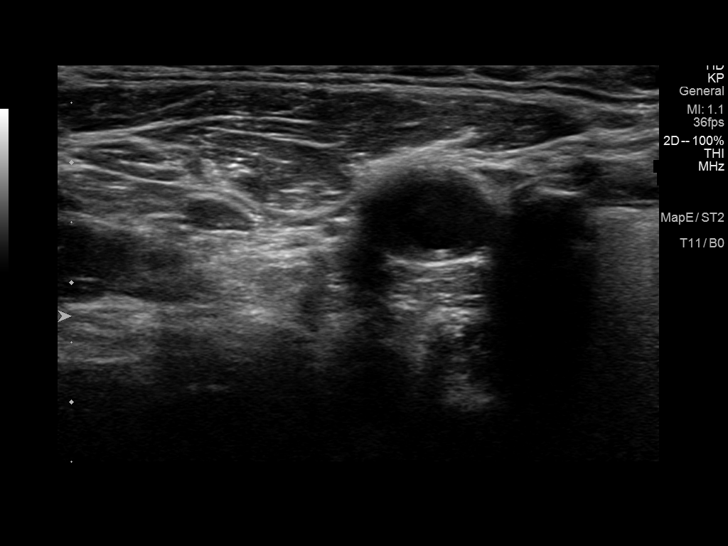
[im 39/43]
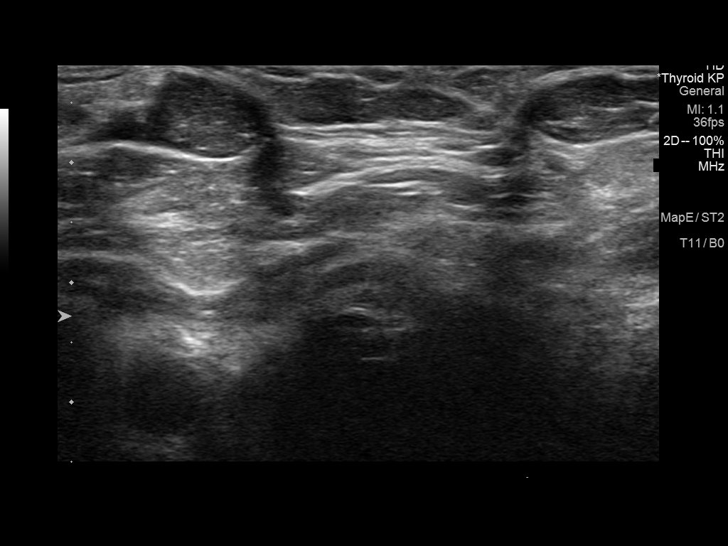
[im 43/43]
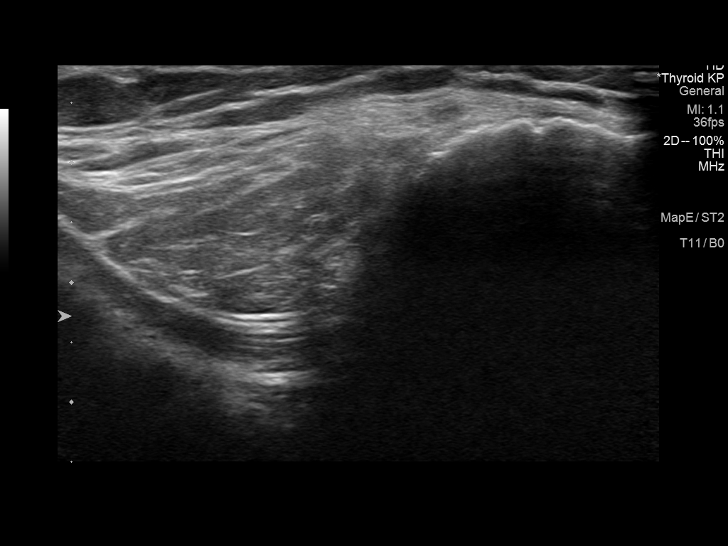

[14 of 25 positions shown; findings below may reference images not displayed]

FINDINGS: Parenchymal Echotexture: Normal

Isthmus: 0.2 cm

Right lobe: 3.5 cm x 1.2 cm x 1.7 cm

Left lobe: 3.7 cm x 1.2 cm x 1.8 cm

_________________________________________________________

Estimated total number of nodules >/= 1 cm: 0

Number of spongiform nodules >/=  2 cm not described below (TR1): 0

Number of mixed cystic and solid nodules >/= 1.5 cm not described
below (TR2): 0

_________________________________________________________

No lesions of the thyroid which meet criteria for surveillance or
biopsy.

No adenopathy.
IMPRESSION: Sonographic survey of the thyroid relatively unremarkable.

## 2021-04-14 DIAGNOSIS — M5451 Vertebrogenic low back pain: Secondary | ICD-10-CM | POA: Diagnosis not present

## 2021-04-14 DIAGNOSIS — M6281 Muscle weakness (generalized): Secondary | ICD-10-CM | POA: Diagnosis not present

## 2021-04-14 DIAGNOSIS — M546 Pain in thoracic spine: Secondary | ICD-10-CM | POA: Diagnosis not present

## 2021-04-14 DIAGNOSIS — M25552 Pain in left hip: Secondary | ICD-10-CM | POA: Diagnosis not present

## 2021-04-19 DIAGNOSIS — M546 Pain in thoracic spine: Secondary | ICD-10-CM | POA: Diagnosis not present

## 2021-04-19 DIAGNOSIS — M5451 Vertebrogenic low back pain: Secondary | ICD-10-CM | POA: Diagnosis not present

## 2021-04-19 DIAGNOSIS — M6281 Muscle weakness (generalized): Secondary | ICD-10-CM | POA: Diagnosis not present

## 2021-04-19 DIAGNOSIS — M25552 Pain in left hip: Secondary | ICD-10-CM | POA: Diagnosis not present

## 2021-04-21 DIAGNOSIS — Z85828 Personal history of other malignant neoplasm of skin: Secondary | ICD-10-CM | POA: Diagnosis not present

## 2021-04-21 DIAGNOSIS — L570 Actinic keratosis: Secondary | ICD-10-CM | POA: Diagnosis not present

## 2021-04-25 DIAGNOSIS — K635 Polyp of colon: Secondary | ICD-10-CM | POA: Diagnosis not present

## 2021-04-25 DIAGNOSIS — Z86010 Personal history of colonic polyps: Secondary | ICD-10-CM | POA: Diagnosis not present

## 2021-04-27 ENCOUNTER — Encounter: Payer: Self-pay | Admitting: Neurology

## 2021-04-27 ENCOUNTER — Ambulatory Visit (INDEPENDENT_AMBULATORY_CARE_PROVIDER_SITE_OTHER): Payer: BC Managed Care – PPO | Admitting: Neurology

## 2021-04-27 VITALS — BP 157/88 | HR 55 | Ht 69.0 in | Wt 157.6 lb

## 2021-04-27 DIAGNOSIS — G20C Parkinsonism, unspecified: Secondary | ICD-10-CM

## 2021-04-27 DIAGNOSIS — G20 Parkinson's disease: Secondary | ICD-10-CM

## 2021-04-27 DIAGNOSIS — G479 Sleep disorder, unspecified: Secondary | ICD-10-CM

## 2021-04-27 DIAGNOSIS — G4752 REM sleep behavior disorder: Secondary | ICD-10-CM

## 2021-04-27 DIAGNOSIS — G20A1 Parkinson's disease without dyskinesia, without mention of fluctuations: Secondary | ICD-10-CM

## 2021-04-27 MED ORDER — ROPINIROLE HCL ER 8 MG PO TB24: 8.0000 mg | ORAL_TABLET | Freq: Every day | ORAL | 3 refills | Status: DC

## 2021-04-27 NOTE — Progress Notes (Signed)
Subjective:    Patient ID: Cristian Campos is a 70 y.o. male.  HPI    Interim history:   Cristian Campos is a 70 year old right-handed gentleman with an underlying medical history of hypertension, hyperlipidemia, ED, carpal tunnel syndrome, shoulder pain, and insomnia, who presents for follow-up consultation of his parkinsonism.  The patient is accompanied by his wife today.  I first met him at the request of his primary care physician on 01/25/2021, at which time he reported an approximately 2-year history of left-sided tremors.  He also had sleep disturbances including difficulty sleeping and dream enactment behaviors.  He was advised to try melatonin at a higher dose for his sleep disturbance.  He was advised to start ropinirole immediate release 0.25 mg strength with gradual increase.  He emailed in the interim reporting side effects with the ropinirole, we eventually switched him to the ropinirole ER, 4 mg once daily and about a month ago increase it to 6 mg strength once daily.  Today, 04/27/2021: He reports doing better with the ropinirole ER.  On 4 mg strength he did not have any telltale response but feels that with the 6 mg strength he has had a modest improvement in his tremor.  He is able to tolerate the medication much better than the immediate release.  He continues to stay active.  He plays golf on a regular basis.  He has not had any recent falls thankfully.  He does have constipation occasionally and takes a small dose of MiraLAX about 3 days out of the week.  He hydrates well with water and Gatorade.  Per wife, his dream enactment behavior is not very prominent currently.  He had a list of questions today which we went over.  He had blood work through his primary care recently.  He was advised to stop his diuretic and his beta-blocker has been increased.  He was found to have a decreased sodium level but it is normalized now.  He was able to pull up his blood test results from  03/09/2021.  BUN was 14, creatinine 0.83, sodium 144, potassium 4.4.  He is inquiring whether he should have a brain MRI, his wife also inquired about a DaTscan and we talked about these different scans today as well.  He had a recent colonoscopy which went well.  His appetite is good, weight is more or less stable.  The patient's allergies, current medications, family history, past medical history, past social history, past surgical history and problem list were reviewed and updated as appropriate.   Previously:   01/25/21: (He) reports an approximately 2-year history of left-sided tremors.  Tremors started first in the left upper extremity and in the past few months has been notable intermittently in the left lower extremity.  He does not have any right-sided symptoms.  Symptoms have been mildly progressive.  In addition, he has had some changes in his appetite and he has had worsening sleep difficulty.  He has a longer standing history of difficulty maintaining sleep.  He has tried multiple medications in the recent past.  I reviewed your office note from 10/27/2020 and he was tried on gabapentin at the time.  He reports that it did not help and he stopped it after trying up to 300 mg at bedtime.  For his tremor, he was advised to increase his atenolol, he was on 25 mg daily and this was increased on 01/11/1999 22 to 50 mg daily, he reports that it did not  affect the tremor.  It did help with his blood pressure though.  He has not had any recent falls but has had some changes in his gait, wife reports that he has had some shuffling in his walk.  He is very active, he likes to work in the yard, he also plays golf, he has noticed some stiffness affecting his left arm and leg.  His wife also endorses that he has had some vivid dreams and had some dream enactment behavior, particularly yelling out in sleep.  He does not recall any particular dreams or vivid dreams.  He has no family history of tremors or Parkinson's  disease.  He is a non-smoker, he lives with his wife, he worked in [employer].  He drinks alcohol in the form of beer, up to 1 or 2/day on average.  Sometimes he drinks scotch in the evening.  For sleep, he has had other medications recently.  He was tried on trazodone which did not help and about a week ago he was tried on Ambien for few days and it helped for the first night but not after that.  He had a follow-up visit with you today and was given a prescription for lorazepam 0.5 mg strength, up to 4 pills at bedtime as I understand.  He has of course not picked up his prescription yet.  His Past Medical History Is Significant For: Past Medical History:  Diagnosis Date   ED (erectile dysfunction)    Hyperlipidemia    Hypertension    Right hydrocele     His Past Surgical History Is Significant For: Past Surgical History:  Procedure Laterality Date   HYDROCELE EXCISION Left 1997   HYDROCELE EXCISION Right 11/23/2015   Procedure: HYDROCELECTOMY ADULT, EXCISION OF SEBACIOUS CYST FROM RIGHT SCROTUM;  Surgeon: Festus Aloe, MD;  Location: [facility];  Service: Urology;  Laterality: Right;   VARICOCELECTOMY  1994    His Family History Is Significant For: Family History  Problem Relation Age of Onset   Parkinson's disease Neg Hx    Tremor Neg Hx     His Social History Is Significant For: Social History   Socioeconomic History   Marital status: Married    Spouse name: Not on file   Number of children: Not on file   Years of education: Not on file   Highest education level: Not on file  Occupational History   Not on file  Tobacco Use   Smoking status: Never   Smokeless tobacco: Never  Vaping Use   Vaping Use: Never used  Substance and Sexual Activity   Alcohol use: Yes    Alcohol/week: 6.0 standard drinks    Types: 3 Cans of beer, 3 Shots of liquor per week    Comment: occasional   Drug use: No   Sexual activity: Not on file  Other Topics  Concern   Not on file  Social History Narrative   Not on file   Social Determinants of Health   Financial Resource Strain: Not on file  Food Insecurity: Not on file  Transportation Needs: Not on file  Physical Activity: Not on file  Stress: Not on file  Social Connections: Not on file    His Allergies Are:  No Known Allergies:   His Current Medications Are:  Outpatient Encounter Medications as of 04/27/2021  Medication Sig   aspirin EC 81 MG tablet Take 81 mg by mouth 4 (four) times a week.   atenolol (TENORMIN) 50  MG tablet Take 100 mg by mouth daily.   LORazepam (ATIVAN) 0.5 MG tablet Take 0.25 mg by mouth 4 (four) times a week.   Multiple Vitamin (MULTIVITAMIN) tablet Take 1 tablet by mouth daily.   Ropinirole HCl (REQUIP XL) 6 MG TB24 Take 1 tablet (6 mg total) by mouth daily.   simvastatin (ZOCOR) 40 MG tablet Take 0.25 tablets by mouth every evening.    VIAGRA 100 MG tablet Take 100 mg by mouth as needed.    triamterene-hydrochlorothiazide (MAXZIDE-25) 37.5-25 MG per tablet Take 1 tablet by mouth every morning.    No facility-administered encounter medications on file as of 04/27/2021.  :  Review of Systems:  Out of a complete 14 point review of systems, all are reviewed and negative with the exception of these symptoms as listed below:  Review of Systems  Neurological:        Pt is here for follow up visit for parkinson's. Pt states he see little change with the Ropinirole with the tremors . Pt states tremors go away for a short period of time . Pt states he has some questions about the Ropinirole .    Objective:  Neurological Exam  Physical Exam Physical Examination:   Vitals:   04/27/21 1010  BP: (!) 157/88  Pulse: (!) 55    General Examination: The patient is a very pleasant 70 y.o. male in no acute distress. He appears well-developed and well-nourished and well groomed.   HEENT: Normocephalic, atraumatic, pupils are equal, round and reactive to light,  mild cataract noted, more so on the right.  Extraocular tracking is fairly well-preserved, mild upgaze limitation noted, face is mildly masked appearing.  He has a mild nuchal rigidity, no lip, neck or jaw tremor, voice is mildly hypophonic, almost a little raspy, no dysarthria noted, all stable.  Airway examination reveals mild mouth dryness, tongue protrudes centrally and palate elevates symmetrically, no carotid bruits.  Hearing grossly intact.     Chest: Clear to auscultation without wheezing, rhonchi or crackles noted.   Heart: S1+S2+0, regular and normal without murmurs, rubs or gallops noted.    Abdomen: Soft, non-tender and non-distended.   Extremities: There is no pitting edema in the distal lower extremities bilaterally.    Skin: Warm and dry without trophic changes noted.  Mild bruising noted across the forearms bilaterally.   Musculoskeletal: exam reveals no obvious joint deformities.    Neurologically:  Mental status: The patient is awake, alert and oriented in all 4 spheres. His immediate and remote memory, attention, language skills and fund of knowledge are appropriate. There is no evidence of aphasia, agnosia, apraxia or anomia. Speech is clear with normal prosody and enunciation. Thought process is linear. Mood is normal and affect is normal.  Cranial nerves II - XII are as described above under HEENT exam. In addition: shoulder shrug is normal with equal shoulder height noted. Motor exam: Normal bulk and strength is noted, tone is slightly increased in the left upper extremity with no telltale cogwheeling noted.  He has a intermittent resting tremor in the left upper extremity and in the left lower extremity also intermittent.  He has no obvious resting tremor in the right upper or right lower extremities.  He has no significant postural or action tremor, no significant intention tremor.   (On 01/25/2021: On Archimedes spiral drawing he has no significant trembling with either  hand, handwriting with the right hand is legible, not particularly tremulous but micrographic appearing.)  On fine  motor testing: He has normal findings with finger taps on the right, minimal impairment in the left upper extremity, hand movements are fairly normal bilaterally and rapid alternating patting is fairly normal bilaterally, foot taps are mildly decreased in amplitude in the left lower extremity.  Foot agility normal on the right with minimal impairment on the left.  Sensory exam is intact to light touch.   Gait, station and balance: He stands without difficulty, posture is slightly stooped for age, he walks with good stride length and fairly good pace, decreased arm swing on the left noted.  He turns without difficulty, balance is preserved.   Assessment and Plan:    In summary, Cristian Campos is a very pleasant 70 year old male with an underlying medical history of hypertension, hyperlipidemia, ED, carpal tunnel syndrome, shoulder pain, and insomnia, who presents for follow-up consultation of his left-sided Parkinson's symptoms, likely left-sided predominant Parkinson's disease.  Symptoms date back to over 2 years ago and started with a left upper extremity tremor intermittently.  He has been on ropinirole ER for the past month.  We started off with ropinirole IR at her visit in June but he could not tolerate it.  He is currently on ropinirole ER 6 mg once daily for the past several weeks.  He is able to tolerate this and has noticed a modest improvement in the tremor.  We talked about his questions today and I suggested we proceed with a brain MRI with and without contrast, I can also function as a baseline for [place] from this point on.  We also talked about potentially proceeding with a DaTscan and I explained the scan to the patient and his wife in detail today.  I suggested we increase his ropinirole ER to 8 mg once daily with the next refill.  He still has about 2 months worth of pills  left.  He is advised to continue to stay active mentally and physically and stay well-hydrated and well rested.  We talked about the importance of good nutrition and constipation control.  If he has ongoing or worsening issues with dream enactment behaviors, he can try melatonin at a higher dose again.  He is advised to follow-up routinely in 4 months, sooner if needed. We went over his questions in detail as well.  He asked about participation in trials.  He is encouraged to [facility] and [facility] and [facility] websites to see if they are offering any Parkinson's related trials.  We also talked about the possibility of him seeking a second opinion for his parkinsonism.  I answered all the questions today and he was in agreement with the plan. I spent 40 minutes in total face-to-face time and in reviewing records during pre-charting, more than 50% of which was spent in counseling and coordination of care, reviewing test results, reviewing medications and treatment regimen and/or in discussing or reviewing the diagnosis of PD, the prognosis and treatment options. Pertinent laboratory and imaging test results that were available during this visit with the patient were reviewed by me and considered in my medical decision making (see chart for details).

## 2021-04-27 NOTE — Patient Instructions (Signed)
It was nice to see you both again today.  I think your Parkinson's disease has remained fairly stable, which is reassuring.  I am glad to hear that you have noticed some benefit from the ropinirole long-acting 6 mg strength.  As discussed, we will increase this at your next refill to 8 mg once daily.  As you know, this disease does progress with time. It can affect your balance, your memory, your mood, your bowel and bladder function, your posture, balance and walking and your activities of daily living. However, there are good supportive treatments and symptomatic treatments available, so most patients have a change to a good quality life and life expectancy is not typically altered. Overall you are doing fairly well but I do want to suggest a few things today:  Remember to drink plenty of fluid at least 6 glasses (8 oz each), eat healthy meals and do not skip any meals. Try to eat protein with a every meal and eat a healthy snack such as fruit or nuts in between meals. Try to keep a regular sleep-wake schedule and try to exercise daily, particularly in the form of walking, 20-30 minutes a day, if you can.   Try to stay active physically and mentally. Engage in social activities in your community and with your family and try to keep up with current events by reading the newspaper or watching the news. Try to do word puzzles and you may like to do puzzles and brain games on the computer such as on [email or web address]/.   Please be proactive about constipation issues.  As far as diagnostic testing, I will order a brain MRI with and without contrast, we will call you to schedule this test after we obtain insurance authorization.    We can consider doing a so-called DaT scan in the very near future. This is a specialized brain scan designed to help with diagnosis of tremor disorders. A radioactive marker gets injected and the uptake is measured in the brain and compared to normal controls and right side is  compared to the left, a change in uptake can help with diagnosis of certain tremor disorders. A brain MRI on the other hand is a brain scan that helps look at the brain structure in more detail overall and look for age-related changes, blood vessel related changes and look for stroke and volume loss which we call atrophy.   I would like to see you back in 4 months, sooner if we need to. Please call us with any interim questions, concerns, problems, updates or refill requests.  You can email me through MyChart and also leave a phone message for my nurse.

## 2021-05-05 ENCOUNTER — Telehealth: Payer: Self-pay | Admitting: Neurology

## 2021-05-05 NOTE — Telephone Encounter (Signed)
[employer] [facility] Harvey: [id number] (exp. 05/05/21 to 07/03/21) order sent to GI, they will reach out to the patient to schedule.

## 2021-06-20 ENCOUNTER — Encounter: Payer: Self-pay | Admitting: Neurology

## 2021-06-20 DIAGNOSIS — G20 Parkinson's disease: Secondary | ICD-10-CM

## 2021-06-20 DIAGNOSIS — G4752 REM sleep behavior disorder: Secondary | ICD-10-CM

## 2021-06-20 NOTE — Telephone Encounter (Signed)
We can proceed with a second opinion and make a referral to neurology movement disorders at [facility] or [facility], please ask patient's first preference, we can also refer to [facility] if he would prefer.  Please place referral after talking to patient.

## 2021-06-21 NOTE — Addendum Note (Signed)
Addended by: Huston Foley on: 06/21/2021 04:58 PM   Modules accepted: Orders

## 2021-06-21 NOTE — Telephone Encounter (Signed)
Referral placed to [facility]. Okay to increase ropinirole to 8 mg once daily.

## 2021-06-22 ENCOUNTER — Other Ambulatory Visit: Payer: Self-pay

## 2021-06-22 ENCOUNTER — Ambulatory Visit
Admission: RE | Admit: 2021-06-22 | Discharge: 2021-06-22 | Disposition: A | Discharge status: Home / Self Care | Payer: BC Managed Care – PPO | Source: Ambulatory Visit | Attending: Neurology | Admitting: Neurology

## 2021-06-22 DIAGNOSIS — G20 Parkinson's disease: Secondary | ICD-10-CM | POA: Diagnosis not present

## 2021-06-22 DIAGNOSIS — G479 Sleep disorder, unspecified: Secondary | ICD-10-CM

## 2021-06-22 DIAGNOSIS — G4752 REM sleep behavior disorder: Secondary | ICD-10-CM

## 2021-06-22 MED ORDER — GADOBENATE DIMEGLUMINE 529 MG/ML IV SOLN
14.0000 mL | Freq: Once | INTRAVENOUS | Status: AC | PRN
  Administered 2021-06-22: 14 mL via INTRAVENOUS

## 2021-06-23 ENCOUNTER — Telehealth: Payer: Self-pay

## 2021-06-23 NOTE — Telephone Encounter (Signed)
-----   Message from Huston Foley, MD sent at 06/23/2021  3:04 PM EST ----- Please call patient regarding the recent brain MRI: The brain scan showed a normal structure of the brain and no significant volume loss which we call atrophy. There were changes in the deeper structures of the brain, which we call white matter changes or microvascular changes. These were reported as mild in His case. These are tiny white spots, that occur with time and are seen in a variety of conditions, including with normal aging, chronic hypertension, chronic headaches, especially migraine HAs, chronic diabetes, chronic hyperlipidemia. These are not strokes and no mass or lesion or contrast enhancement was seen which is reassuring. Again, there were no acute findings, such as a stroke, or mass or blood products. No further action is required on this test at this time, other than re-enforcing the importance of good blood pressure control, good cholesterol control, good blood sugar control, and weight management. Please remind patient to keep any upcoming appointments or tests and to call us with any interim questions, concerns, problems or updates. Thanks,  Huston Foley, MD, PhD

## 2021-06-23 NOTE — Telephone Encounter (Signed)
I called patient. I discussed his MRI results and recommendations. Patient will follow up as scheduled in January. Pt verbalized understanding of results. Pt had no questions at this time but was encouraged to call back if questions arise.

## 2021-06-27 NOTE — Telephone Encounter (Signed)
Referral faxed to [facility], phone # [phone]. 

## 2021-07-05 NOTE — Telephone Encounter (Signed)
Please look into [facility] appt contact number. I am not sure what treatments and trials they are currently involved in, to be honest, but they will hopefully go over this during the appt.

## 2021-07-25 DIAGNOSIS — G20 Parkinson's disease: Secondary | ICD-10-CM | POA: Diagnosis not present

## 2021-08-11 DIAGNOSIS — I10 Essential (primary) hypertension: Secondary | ICD-10-CM | POA: Diagnosis not present

## 2021-08-11 DIAGNOSIS — G20 Parkinson's disease: Secondary | ICD-10-CM | POA: Diagnosis not present

## 2021-08-11 DIAGNOSIS — G4700 Insomnia, unspecified: Secondary | ICD-10-CM | POA: Diagnosis not present

## 2021-08-11 DIAGNOSIS — E782 Mixed hyperlipidemia: Secondary | ICD-10-CM | POA: Diagnosis not present

## 2021-08-15 ENCOUNTER — Encounter: Payer: Self-pay | Admitting: *Deleted

## 2021-08-16 ENCOUNTER — Ambulatory Visit: Payer: BC Managed Care – PPO | Admitting: Neurology

## 2021-08-16 NOTE — Telephone Encounter (Signed)
Appt for today canceled. 

## 2021-08-31 ENCOUNTER — Ambulatory Visit: Payer: BC Managed Care – PPO | Admitting: Neurology

## 2021-09-26 ENCOUNTER — Ambulatory Visit: Payer: BC Managed Care – PPO | Admitting: Neurology

## 2021-09-28 DIAGNOSIS — R4189 Other symptoms and signs involving cognitive functions and awareness: Secondary | ICD-10-CM | POA: Diagnosis not present

## 2021-09-28 DIAGNOSIS — G20 Parkinson's disease: Secondary | ICD-10-CM | POA: Diagnosis not present

## 2021-09-28 DIAGNOSIS — R269 Unspecified abnormalities of gait and mobility: Secondary | ICD-10-CM | POA: Diagnosis not present

## 2021-10-17 DIAGNOSIS — Z85828 Personal history of other malignant neoplasm of skin: Secondary | ICD-10-CM | POA: Diagnosis not present

## 2021-10-17 DIAGNOSIS — L821 Other seborrheic keratosis: Secondary | ICD-10-CM | POA: Diagnosis not present

## 2021-10-17 DIAGNOSIS — C44612 Basal cell carcinoma of skin of right upper limb, including shoulder: Secondary | ICD-10-CM | POA: Diagnosis not present

## 2021-10-17 DIAGNOSIS — L570 Actinic keratosis: Secondary | ICD-10-CM | POA: Diagnosis not present

## 2021-10-17 DIAGNOSIS — D225 Melanocytic nevi of trunk: Secondary | ICD-10-CM | POA: Diagnosis not present

## 2021-11-17 DIAGNOSIS — D225 Melanocytic nevi of trunk: Secondary | ICD-10-CM | POA: Diagnosis not present

## 2021-11-17 DIAGNOSIS — L905 Scar conditions and fibrosis of skin: Secondary | ICD-10-CM | POA: Diagnosis not present

## 2021-11-17 DIAGNOSIS — D485 Neoplasm of uncertain behavior of skin: Secondary | ICD-10-CM | POA: Diagnosis not present

## 2021-12-23 DIAGNOSIS — G20 Parkinson's disease: Secondary | ICD-10-CM | POA: Diagnosis not present

## 2021-12-29 DIAGNOSIS — D485 Neoplasm of uncertain behavior of skin: Secondary | ICD-10-CM | POA: Diagnosis not present

## 2021-12-29 DIAGNOSIS — D225 Melanocytic nevi of trunk: Secondary | ICD-10-CM | POA: Diagnosis not present

## 2022-01-09 DIAGNOSIS — Z4802 Encounter for removal of sutures: Secondary | ICD-10-CM | POA: Diagnosis not present

## 2022-01-26 DIAGNOSIS — U071 COVID-19: Secondary | ICD-10-CM | POA: Diagnosis not present

## 2022-03-20 DIAGNOSIS — E782 Mixed hyperlipidemia: Secondary | ICD-10-CM | POA: Diagnosis not present

## 2022-03-20 DIAGNOSIS — I10 Essential (primary) hypertension: Secondary | ICD-10-CM | POA: Diagnosis not present

## 2022-03-20 DIAGNOSIS — Z0000 Encounter for general adult medical examination without abnormal findings: Secondary | ICD-10-CM | POA: Diagnosis not present

## 2022-03-20 DIAGNOSIS — R7301 Impaired fasting glucose: Secondary | ICD-10-CM | POA: Diagnosis not present

## 2022-03-20 DIAGNOSIS — Z125 Encounter for screening for malignant neoplasm of prostate: Secondary | ICD-10-CM | POA: Diagnosis not present

## 2022-03-20 DIAGNOSIS — G20 Parkinson's disease: Secondary | ICD-10-CM | POA: Diagnosis not present

## 2022-04-26 DIAGNOSIS — Z23 Encounter for immunization: Secondary | ICD-10-CM | POA: Diagnosis not present

## 2022-06-22 DIAGNOSIS — H53143 Visual discomfort, bilateral: Secondary | ICD-10-CM | POA: Diagnosis not present

## 2022-06-26 DIAGNOSIS — G20A1 Parkinson's disease without dyskinesia, without mention of fluctuations: Secondary | ICD-10-CM | POA: Diagnosis not present

## 2022-10-24 DIAGNOSIS — L821 Other seborrheic keratosis: Secondary | ICD-10-CM | POA: Diagnosis not present

## 2022-10-24 DIAGNOSIS — D225 Melanocytic nevi of trunk: Secondary | ICD-10-CM | POA: Diagnosis not present

## 2022-10-24 DIAGNOSIS — Z85828 Personal history of other malignant neoplasm of skin: Secondary | ICD-10-CM | POA: Diagnosis not present

## 2022-10-24 DIAGNOSIS — L905 Scar conditions and fibrosis of skin: Secondary | ICD-10-CM | POA: Diagnosis not present

## 2022-11-28 DIAGNOSIS — C44712 Basal cell carcinoma of skin of right lower limb, including hip: Secondary | ICD-10-CM | POA: Diagnosis not present

## 2023-01-18 DIAGNOSIS — G20A1 Parkinson's disease without dyskinesia, without mention of fluctuations: Secondary | ICD-10-CM | POA: Diagnosis not present

## 2023-01-18 DIAGNOSIS — K5900 Constipation, unspecified: Secondary | ICD-10-CM | POA: Insufficient documentation

## 2023-12-14 ENCOUNTER — Other Ambulatory Visit: Payer: Self-pay | Admitting: Thoracic Surgery (Cardiothoracic Vascular Surgery)

## 2023-12-14 DIAGNOSIS — S2239XA Fracture of one rib, unspecified side, initial encounter for closed fracture: Secondary | ICD-10-CM

## 2023-12-17 ENCOUNTER — Encounter: Payer: Self-pay | Admitting: Thoracic Surgery (Cardiothoracic Vascular Surgery)

## 2023-12-17 ENCOUNTER — Ambulatory Visit: Payer: Self-pay | Admitting: Thoracic Surgery (Cardiothoracic Vascular Surgery)

## 2023-12-17 ENCOUNTER — Ambulatory Visit (HOSPITAL_COMMUNITY)
Admission: RE | Admit: 2023-12-17 | Discharge: 2023-12-17 | Disposition: A | Discharge status: Home / Self Care | Source: Ambulatory Visit | Attending: Cardiology | Admitting: Cardiology

## 2023-12-17 VITALS — BP 155/81 | HR 65 | Resp 18 | Ht 69.0 in | Wt 149.0 lb

## 2023-12-17 DIAGNOSIS — S2239XA Fracture of one rib, unspecified side, initial encounter for closed fracture: Secondary | ICD-10-CM

## 2023-12-17 NOTE — Progress Notes (Signed)
      [address]       Cristian Campos [id number]             [phone]     HPI: Mr. Cristian Campos is sent for a follow-up of rib fractures.  Cristian Campos is a 73 year old man with a history of Parkinson's, hyperlipidemia, and hypertension.  Cristian Campos was skiing about a month ago and fell landing on his ski pole on the left side.  Cristian Campos had a chest x-ray done which showed fractures of the left 8th and 9th ribs.  Cristian Campos continued to ski after the injury.  Cristian Campos has discomfort in the left rib cage.  The worst pain is when Cristian Campos is riding a mower.  Cristian Campos is able to play golf as long as Cristian Campos does not swing too hard.  Past Medical History:  Diagnosis Date   ED (erectile dysfunction)    Hyperlipidemia    Hypertension    Right hydrocele     Current Outpatient Medications  Medication Sig Dispense Refill   aspirin EC 81 MG tablet Take 81 mg by mouth 4 (four) times a week.     atenolol (TENORMIN) 50 MG tablet Take 50 mg by mouth daily.     BELSOMRA 20 MG TABS 1/2 tablet at bedtime as needed Orally Once a day for insomnia for 90 days     carbidopa-levodopa (SINEMET IR) 25-100 MG tablet 1.5 tablet Orally three times a day     LORazepam (ATIVAN) 0.5 MG tablet Take 0.25 mg by mouth 4 (four) times a week. Takes 2 tabs at night     Multiple Vitamin (MULTIVITAMIN) tablet Take 1 tablet by mouth daily.     simvastatin (ZOCOR) 40 MG tablet Take 0.25 tablets by mouth every evening.      VIAGRA 100 MG tablet Take 100 mg by mouth as needed.      No current facility-administered medications for this visit.    Physical Exam BP (!) 155/81 (BP Location: Left Arm)   Pulse 65   Resp 18   Ht 5\' 9"  (1.753 m)   Wt 149 lb (67.6 kg)   SpO2 97% Comment: RA  BMI 22.36 kg/m  73 year old man in no acute distress Alert and oriented x 3 with no focal deficits Lungs clear bilaterally Cardiac regular rate and rhythm No significant tenderness to palpation  Diagnostic Tests: I reviewed his chest x-ray images.  Official  reading is not yet been done.  There is a small fracture laterally on the left eighth rib.  Unable to visualize the ninth rib fracture that was present on his previous film, of which Cristian Campos had a picture on his phone.  Impression: Cristian Campos is a 73 year old man who recently had a fall while skiing.  Cristian Campos suffered nondisplaced fractures of the left 8th and 9th ribs.  No pneumothorax.  Cristian Campos does have some pain with certain activities.  But is able to play golf and participate in other activities.  Cristian Campos has been taking Tylenol  and Advil as needed.  There are no restrictions on his activities.  Cristian Campos was cautioned to avoid movements and activities that cause pain.  Pain should improve with time but may take several months.  I will be happy to see Cristian Campos back at anytime in future if I can be of any assistance with his care   Zelphia Higashi, MD [facility] [phone]

## 2024-04-21 ENCOUNTER — Other Ambulatory Visit: Payer: Self-pay | Admitting: Surgery

## 2024-06-09 ENCOUNTER — Encounter (HOSPITAL_BASED_OUTPATIENT_CLINIC_OR_DEPARTMENT_OTHER)
Admission: RE | Admit: 2024-06-09 | Discharge: 2024-06-09 | Disposition: A | Discharge status: Home / Self Care | Source: Ambulatory Visit | Attending: Surgery | Admitting: Surgery

## 2024-06-09 ENCOUNTER — Encounter (HOSPITAL_BASED_OUTPATIENT_CLINIC_OR_DEPARTMENT_OTHER): Payer: Self-pay | Admitting: Surgery

## 2024-06-09 DIAGNOSIS — Z01818 Encounter for other preprocedural examination: Secondary | ICD-10-CM | POA: Diagnosis present

## 2024-06-09 MED ORDER — CHLORHEXIDINE GLUCONATE CLOTH 2 % EX PADS: 6.0000 | MEDICATED_PAD | Freq: Once | CUTANEOUS | Status: DC

## 2024-06-09 MED ORDER — ENSURE PRE-SURGERY PO LIQD: 296.0000 mL | Freq: Once | ORAL | Status: DC

## 2024-06-09 NOTE — Progress Notes (Addendum)

## 2024-06-11 NOTE — Anesthesia Preprocedure Evaluation (Addendum)
 Anesthesia Evaluation  Patient identified by MRN, date of birth, ID band Patient awake    Reviewed: Allergy & Precautions, NPO status , Patient's Chart, lab work & pertinent test results, reviewed documented beta blocker date and time   Airway Mallampati: II  TM Distance: >3 FB Neck ROM: Full    Dental  (+) Teeth Intact, Dental Advisory Given   Pulmonary neg pulmonary ROS   Pulmonary exam normal breath sounds clear to auscultation       Cardiovascular hypertension (171/90preop,normally130sSBP), Pt. on medications and Pt. on home beta blockers Normal cardiovascular exam Rhythm:Regular Rate:Normal     Neuro/Psych parkinsons  negative psych ROS   GI/Hepatic negative GI ROS, Neg liver ROS,,,  Endo/Other  negative endocrine ROS    Renal/GU negative Renal ROS  negative genitourinary   Musculoskeletal negative musculoskeletal ROS (+)    Abdominal   Peds  Hematology negative hematology ROS (+)   Anesthesia Other Findings   Reproductive/Obstetrics negative OB ROS                              Anesthesia Physical Anesthesia Plan  ASA: 2  Anesthesia Plan: General and Regional   Post-op Pain Management: Tylenol  PO (pre-op)* and Regional block*   Induction: Intravenous  PONV Risk Score and Plan: 2 and Ondansetron , Dexamethasone , Midazolam  and Treatment may vary due to age or medical condition  Airway Management Planned: LMA  Additional Equipment:   Intra-op Plan:   Post-operative Plan: Extubation in OR  Informed Consent: I have reviewed the patients History and Physical, chart, labs and discussed the procedure including the risks, benefits and alternatives for the proposed anesthesia with the patient or authorized representative who has indicated his/her understanding and acceptance.     Dental advisory given  Plan Discussed with: CRNA  Anesthesia Plan Comments:           Anesthesia Quick Evaluation

## 2024-06-15 NOTE — H&P (Signed)
 REFERRING PHYSICIAN: Loreli Kins, MD PROVIDER: VICENTA DASIE POLI, MD MRN: [id number] DOB: [birth year]  Subjective   Chief Complaint: New Consultation (Inguinal Hernia)  History of Present Illness: Cristian Campos is a 73 y.o. male who is seen s an office consultation for evaluation of New Consultation (Inguinal Hernia)  This is a pleasant 73 year old gentleman who is here for evaluation of a left inguinal hernia. He has noticed a bulge for at least a year. It is starting to get larger but causes no discomfort. He has had no issues and has not noticed any bulge on the right. He is very active and plays golf and skis. He is otherwise without complaints. He does have Parkinson's but no cardiopulmonary issues.  Review of Systems: A complete review of systems was obtained from the patient. I have reviewed this information and discussed as appropriate with the patient. See HPI as well for other ROS.  ROS   Medical History: Past Medical History:  Diagnosis Date  ED (erectile dysfunction)  Hyperlipidemia  Hypertension  Right hydrocele   There is no problem list on file for this patient.  Past Surgical History:  Procedure Laterality Date  LAPAROSCOPIC VARICOCELECTOMY 1994  HYDROCELE EXCISION / REPAIR Left 1997  HYDROCELE EXCISION / REPAIR Right 11/23/2015    No Known Allergies  Current Outpatient Medications on File Prior to Visit  Medication Sig Dispense Refill  aspirin 81 MG EC tablet Take 81 mg by mouth 4 per week  atenoloL (TENORMIN) 50 MG tablet Take 50 mg by mouth once daily  BELSOMRA 20 mg tablet TAKE 1/2 TABLET BY MOUTH ONCE DAILY AT BEDTIME AS NEEDED FOR INSOMNIA  carbidopa-levodopa (SINEMET CR) 50-200 mg CR tablet Take 1 tablet by mouth 3 (three) times daily  LORazepam (ATIVAN) 0.5 MG tablet TAKE 1 TO 4 TABLETS BY MOUTH BEFORE BEDTIME AS NEEDED FOR TREMORS  multivitamin tablet Take 1 tablet by mouth once daily  simvastatin (ZOCOR) 10 MG tablet Take 10 mg  by mouth at bedtime   No current facility-administered medications on file prior to visit.   Family History  Family history unknown: Yes    Social History   Tobacco Use  Smoking Status Never  Smokeless Tobacco Never    Social History   Socioeconomic History  Marital status: Married  Tobacco Use  Smoking status: Never  Smokeless tobacco: Never  Substance and Sexual Activity  Alcohol use: Yes  Alcohol/week: 4.0 - 14.0 standard drinks of alcohol  Types: 4 - 14 Standard drinks or equivalent per week  Drug use: Never  Sexual activity: Defer   Social Drivers of Health   Housing Stability: Unknown (04/21/2024)  Housing Stability Vital Sign  Homeless in the Last Year: No   Objective:   Vitals:   BP: (!) 160/87  Pulse: 62  Temp: 36.6 C (97.9 F)  TempSrc: Temporal  SpO2: 99%  Weight: 69.9 kg (154 lb)  Height: 172.7 cm (5' 8)  PainSc: 0-No pain   Body mass index is 23.42 kg/m.  Physical Exam   He appears well on exam  He has an easily reducible left inguinal hernia. With him both lying and standing with vigorous Valsalva maneuvers I cannot feel a left inguinal hernia  Labs, Imaging and Diagnostic Testing: I reviewed his notes in the electronic medical records  Assessment and Plan:   Diagnoses and all orders for this visit:  Left inguinal hernia   At this point we discussed abdominal wall anatomy and hernias. As his hernia is  getting larger and he is quite active, I would recommend hernia repair with mesh. We discussed risk of incarceration and strangulation of bowel without repair if he chooses to continue conservative management. I discussed both the laparoscopic and open techniques. I would recommend an open left inguinal hernia repair with mesh with an LMA and tap block if possible to limit anesthesia. I discussed the reasons for this with him. I explained the surgical procedure in detail. We discussed the risks which includes but is not limited to  bleeding, infection, injury to surrounding structures, nerve entrapment, chronic pain, use of mesh, hernia recurrence, cardiopulmonary issues with anesthesia, postoperative recovery, etc. He understands and wished to proceed with surgery which will be scheduled.

## 2024-06-16 ENCOUNTER — Encounter (HOSPITAL_BASED_OUTPATIENT_CLINIC_OR_DEPARTMENT_OTHER)
Admission: RE | Disposition: A | Discharge status: Home / Self Care | Payer: Self-pay | Source: Home / Self Care | Attending: Surgery

## 2024-06-16 ENCOUNTER — Other Ambulatory Visit: Payer: Self-pay

## 2024-06-16 ENCOUNTER — Ambulatory Visit (HOSPITAL_BASED_OUTPATIENT_CLINIC_OR_DEPARTMENT_OTHER)
Admission: RE | Admit: 2024-06-16 | Discharge: 2024-06-16 | Disposition: A | Discharge status: Home / Self Care | Attending: Surgery | Admitting: Surgery

## 2024-06-16 ENCOUNTER — Encounter (HOSPITAL_BASED_OUTPATIENT_CLINIC_OR_DEPARTMENT_OTHER): Payer: Self-pay | Admitting: Surgery

## 2024-06-16 ENCOUNTER — Ambulatory Visit (HOSPITAL_BASED_OUTPATIENT_CLINIC_OR_DEPARTMENT_OTHER): Payer: Self-pay | Admitting: Anesthesiology

## 2024-06-16 DIAGNOSIS — G20A1 Parkinson's disease without dyskinesia, without mention of fluctuations: Secondary | ICD-10-CM | POA: Diagnosis not present

## 2024-06-16 DIAGNOSIS — E785 Hyperlipidemia, unspecified: Secondary | ICD-10-CM | POA: Diagnosis not present

## 2024-06-16 DIAGNOSIS — D176 Benign lipomatous neoplasm of spermatic cord: Secondary | ICD-10-CM | POA: Insufficient documentation

## 2024-06-16 DIAGNOSIS — I10 Essential (primary) hypertension: Secondary | ICD-10-CM | POA: Insufficient documentation

## 2024-06-16 DIAGNOSIS — K4090 Unilateral inguinal hernia, without obstruction or gangrene, not specified as recurrent: Secondary | ICD-10-CM | POA: Diagnosis not present

## 2024-06-16 DIAGNOSIS — Z79899 Other long term (current) drug therapy: Secondary | ICD-10-CM | POA: Diagnosis not present

## 2024-06-16 DIAGNOSIS — Z01818 Encounter for other preprocedural examination: Secondary | ICD-10-CM

## 2024-06-16 HISTORY — PX: INGUINAL HERNIA REPAIR: SHX194

## 2024-06-16 HISTORY — DX: Parkinson's disease without dyskinesia, without mention of fluctuations: G20.A1

## 2024-06-16 SURGERY — REPAIR, HERNIA, INGUINAL, ADULT
Anesthesia: Regional | Laterality: Left

## 2024-06-16 MED ORDER — ACETAMINOPHEN 500 MG PO TABS: 1000.0000 mg | ORAL_TABLET | Freq: Once | ORAL | Status: DC

## 2024-06-16 MED ORDER — PROPOFOL 10 MG/ML IV BOLUS
INTRAVENOUS | Status: DC | PRN
  Administered 2024-06-16: 150 mg via INTRAVENOUS

## 2024-06-16 MED ORDER — TRAMADOL HCL 50 MG PO TABS: 50.0000 mg | ORAL_TABLET | Freq: Four times a day (QID) | ORAL | 0 refills | Status: AC | PRN

## 2024-06-16 MED ORDER — BUPIVACAINE-EPINEPHRINE (PF) 0.5% -1:200000 IJ SOLN
INTRAMUSCULAR | Status: AC
  Filled 2024-06-16: qty 120

## 2024-06-16 MED ORDER — FENTANYL CITRATE (PF) 100 MCG/2ML IJ SOLN
100.0000 ug | Freq: Once | INTRAMUSCULAR | Status: AC
  Administered 2024-06-16: 50 ug via INTRAVENOUS

## 2024-06-16 MED ORDER — FENTANYL CITRATE (PF) 100 MCG/2ML IJ SOLN
INTRAMUSCULAR | Status: AC
  Filled 2024-06-16: qty 2

## 2024-06-16 MED ORDER — ONDANSETRON HCL 4 MG/2ML IJ SOLN: 4.0000 mg | Freq: Once | INTRAMUSCULAR | Status: DC | PRN

## 2024-06-16 MED ORDER — LACTATED RINGERS IV SOLN
INTRAVENOUS | Status: DC
Start: 2024-06-16 — End: 2024-06-16

## 2024-06-16 MED ORDER — HYDROMORPHONE HCL 1 MG/ML IJ SOLN: 0.2500 mg | INTRAMUSCULAR | Status: DC | PRN

## 2024-06-16 MED ORDER — PROPOFOL 10 MG/ML IV BOLUS
INTRAVENOUS | Status: AC
  Filled 2024-06-16: qty 20

## 2024-06-16 MED ORDER — PHENYLEPHRINE HCL (PRESSORS) 10 MG/ML IV SOLN
INTRAVENOUS | Status: DC | PRN
  Administered 2024-06-16: 160 ug via INTRAVENOUS
  Administered 2024-06-16: 80 ug via INTRAVENOUS
  Administered 2024-06-16: 160 ug via INTRAVENOUS

## 2024-06-16 MED ORDER — ACETAMINOPHEN 500 MG PO TABS
ORAL_TABLET | ORAL | Status: AC
  Filled 2024-06-16: qty 2

## 2024-06-16 MED ORDER — PHENYLEPHRINE 80 MCG/ML (10ML) SYRINGE FOR IV PUSH (FOR BLOOD PRESSURE SUPPORT)
PREFILLED_SYRINGE | INTRAVENOUS | Status: AC
  Filled 2024-06-16: qty 10

## 2024-06-16 MED ORDER — MIDAZOLAM HCL (PF) 2 MG/2ML IJ SOLN
2.0000 mg | Freq: Once | INTRAMUSCULAR | Status: AC
  Administered 2024-06-16: 1 mg via INTRAVENOUS

## 2024-06-16 MED ORDER — KETOROLAC TROMETHAMINE 30 MG/ML IJ SOLN
INTRAMUSCULAR | Status: DC | PRN
  Administered 2024-06-16: 15 mg via INTRAVENOUS

## 2024-06-16 MED ORDER — DEXAMETHASONE SODIUM PHOSPHATE 4 MG/ML IJ SOLN
INTRAMUSCULAR | Status: DC | PRN
  Administered 2024-06-16: 5 mg via INTRAVENOUS

## 2024-06-16 MED ORDER — OXYCODONE HCL 5 MG/5ML PO SOLN: 5.0000 mg | Freq: Once | ORAL | Status: DC | PRN

## 2024-06-16 MED ORDER — DEXAMETHASONE SOD PHOSPHATE PF 10 MG/ML IJ SOLN
INTRAMUSCULAR | Status: DC | PRN
  Administered 2024-06-16: 10 mg via PERINEURAL

## 2024-06-16 MED ORDER — CEFAZOLIN SODIUM-DEXTROSE 2-4 GM/100ML-% IV SOLN
INTRAVENOUS | Status: AC
  Filled 2024-06-16: qty 100

## 2024-06-16 MED ORDER — ONDANSETRON HCL 4 MG/2ML IJ SOLN
INTRAMUSCULAR | Status: AC
  Filled 2024-06-16: qty 2

## 2024-06-16 MED ORDER — ACETAMINOPHEN 10 MG/ML IV SOLN
INTRAVENOUS | Status: AC
  Filled 2024-06-16: qty 100

## 2024-06-16 MED ORDER — AMISULPRIDE (ANTIEMETIC) 5 MG/2ML IV SOLN
10.0000 mg | Freq: Once | INTRAVENOUS | Status: DC | PRN
Start: 2024-06-16 — End: 2024-06-16

## 2024-06-16 MED ORDER — OXYCODONE HCL 5 MG PO TABS: 5.0000 mg | ORAL_TABLET | Freq: Once | ORAL | Status: DC | PRN

## 2024-06-16 MED ORDER — MIDAZOLAM HCL 2 MG/2ML IJ SOLN
INTRAMUSCULAR | Status: AC
  Filled 2024-06-16: qty 2

## 2024-06-16 MED ORDER — ONDANSETRON HCL 4 MG/2ML IJ SOLN
INTRAMUSCULAR | Status: DC | PRN
  Administered 2024-06-16: 4 mg via INTRAVENOUS

## 2024-06-16 MED ORDER — CEFAZOLIN SODIUM-DEXTROSE 2-4 GM/100ML-% IV SOLN
2.0000 g | INTRAVENOUS | Status: AC
  Administered 2024-06-16: 2 g via INTRAVENOUS

## 2024-06-16 MED ORDER — BUPIVACAINE-EPINEPHRINE 0.5% -1:200000 IJ SOLN
INTRAMUSCULAR | Status: DC | PRN
  Administered 2024-06-16: 10 mL

## 2024-06-16 MED ORDER — FENTANYL CITRATE (PF) 100 MCG/2ML IJ SOLN
INTRAMUSCULAR | Status: DC | PRN
  Administered 2024-06-16: 25 ug via INTRAVENOUS

## 2024-06-16 MED ORDER — LIDOCAINE HCL (CARDIAC) PF 100 MG/5ML IV SOSY
PREFILLED_SYRINGE | INTRAVENOUS | Status: DC | PRN
  Administered 2024-06-16: 60 mg via INTRAVENOUS

## 2024-06-16 MED ORDER — ROPIVACAINE HCL 5 MG/ML IJ SOLN
INTRAMUSCULAR | Status: DC | PRN
  Administered 2024-06-16: 30 mL via PERINEURAL

## 2024-06-16 MED ORDER — ACETAMINOPHEN 10 MG/ML IV SOLN
INTRAVENOUS | Status: DC | PRN
  Administered 2024-06-16: 1000 mg via INTRAVENOUS

## 2024-06-16 SURGICAL SUPPLY — 33 items
BLADE CLIPPER SURG (BLADE) ×1 IMPLANT
BLADE SURG 15 STRL LF DISP TIS (BLADE) ×1 IMPLANT
CANISTER SUCT 1200ML W/VALVE (MISCELLANEOUS) IMPLANT
CHLORAPREP W/TINT 26 (MISCELLANEOUS) ×1 IMPLANT
COVER BACK TABLE 60X90IN (DRAPES) ×1 IMPLANT
COVER MAYO STAND STRL (DRAPES) ×1 IMPLANT
DERMABOND ADVANCED .7 DNX12 (GAUZE/BANDAGES/DRESSINGS) ×1 IMPLANT
DRAIN PENROSE .5X12 LATEX STL (DRAIN) ×1 IMPLANT
DRAPE LAPAROTOMY 100X72 PEDS (DRAPES) ×1 IMPLANT
DRAPE UTILITY XL STRL (DRAPES) ×1 IMPLANT
ELECTRODE REM PT RTRN 9FT ADLT (ELECTROSURGICAL) ×1 IMPLANT
GLOVE SURG SIGNA 7.5 PF LTX (GLOVE) ×1 IMPLANT
GOWN STRL REUS W/ TWL LRG LVL3 (GOWN DISPOSABLE) ×1 IMPLANT
GOWN STRL REUS W/ TWL XL LVL3 (GOWN DISPOSABLE) ×1 IMPLANT
MESH PARIETEX PROGRIP LEFT (Mesh General) IMPLANT
NDL HYPO 25X1 1.5 SAFETY (NEEDLE) ×1 IMPLANT
NEEDLE HYPO 25X1 1.5 SAFETY (NEEDLE) ×1 IMPLANT
PACK BASIN DAY SURGERY FS (CUSTOM PROCEDURE TRAY) ×1 IMPLANT
PENCIL SMOKE EVACUATOR (MISCELLANEOUS) ×1 IMPLANT
SLEEVE SCD COMPRESS KNEE MED (STOCKING) ×1 IMPLANT
SOLN 0.9% NACL POUR BTL 1000ML (IV SOLUTION) IMPLANT
SPIKE FLUID TRANSFER (MISCELLANEOUS) IMPLANT
SPONGE INTESTINAL PEANUT (DISPOSABLE) IMPLANT
SPONGE T-LAP 4X18 ~~LOC~~+RFID (SPONGE) ×1 IMPLANT
SUT MNCRL AB 4-0 PS2 18 (SUTURE) ×1 IMPLANT
SUT SILK 2 0 SH (SUTURE) IMPLANT
SUT VIC AB 2-0 CT1 TAPERPNT 27 (SUTURE) ×2 IMPLANT
SUT VIC AB 3-0 CT1 27XBRD (SUTURE) ×1 IMPLANT
SYR BULB EAR ULCER 3OZ GRN STR (SYRINGE) IMPLANT
SYR CONTROL 10ML LL (SYRINGE) ×1 IMPLANT
TOWEL GREEN STERILE FF (TOWEL DISPOSABLE) ×1 IMPLANT
TUBE CONNECTING 20X1/4 (TUBING) IMPLANT
YANKAUER SUCT BULB TIP NO VENT (SUCTIONS) IMPLANT

## 2024-06-16 NOTE — Progress Notes (Signed)
Assisted Dr. Doroteo Glassman with left, transabdominal plane, ultrasound guided block. Side rails up, monitors on throughout procedure. See vital signs in flow sheet. Tolerated Procedure well.

## 2024-06-16 NOTE — Discharge Instructions (Addendum)
 [facility]__[facility]  UMBILICAL OR INGUINAL HERNIA REPAIR: POST OP INSTRUCTIONS  Always review your discharge instruction sheet given to you by the facility where your surgery was performed. IF YOU HAVE DISABILITY OR FAMILY LEAVE FORMS, YOU MUST BRING THEM TO THE OFFICE FOR PROCESSING.   DO NOT GIVE THEM TO YOUR DOCTOR.  1. A  prescription for pain medication may be given to you upon discharge.  Take your pain medication as prescribed, if needed.  If narcotic pain medicine is not needed, then you may take acetaminophen  (Tylenol ) or ibuprofen (Advil) as needed. 2. Take your usually prescribed medications unless otherwise directed. If you need a refill on your pain medication, please contact your pharmacy.  They will contact our office to request authorization. Prescriptions will not be filled after 5 pm or on week-ends. 3. You should follow a light diet the first 24 hours after arrival home, such as soup and crackers, etc.  Be sure to include lots of fluids daily.  Resume your normal diet the day after surgery. 4.Most patients will experience some swelling and bruising around the umbilicus or in the groin and scrotum.  Ice packs and reclining will help.  Swelling and bruising can take several days to resolve.  6. It is common to experience some constipation if taking pain medication after surgery.  Increasing fluid intake and taking a stool softener (such as Colace) will usually help or prevent this problem from occurring.  A mild laxative (Milk of Magnesia or Miralax) should be taken according to package directions if there are no bowel movements after 48 hours. 7. Unless discharge instructions indicate otherwise, you may remove your bandages 24-48 hours after surgery, and you may shower at that time.  You may have steri-strips (small skin tapes) in place directly over the incision.  These strips should be left on the skin for 7-10 days.  If your surgeon used skin glue on the  incision, you may shower in 24 hours.  The glue will flake off over the next 2-3 weeks.  Any sutures or staples will be removed at the office during your follow-up visit. 8. ACTIVITIES:  You may resume regular (light) daily activities beginning the next day--such as daily self-care, walking, climbing stairs--gradually increasing activities as tolerated.  You may have sexual intercourse when it is comfortable.  Refrain from any heavy lifting or straining until approved by your doctor.  a.You may drive when you are no longer taking prescription pain medication, you can comfortably wear a seatbelt, and you can safely maneuver your car and apply brakes. b.RETURN TO WORK:   _____________________________________________  9.You should see your doctor in the office for a follow-up appointment approximately 2-3 weeks after your surgery.  Make sure that you call for this appointment within a day or two after you arrive home to insure a convenient appointment time. 10.OTHER INSTRUCTIONS: YOU MAY SHOWER STARTING TOMORROW ICE PACK, TYLENOL , AND IBUPROFEN ALSO FOR PAIN NO LIFTING MORE THAN 15 POUNDS FOR 4 WEEKS    _____________________________________  WHEN TO CALL YOUR DOCTOR: Fever over 101.0 Inability to urinate Nausea and/or vomiting Extreme swelling or bruising Continued bleeding from incision. Increased pain, redness, or drainage from the incision  The clinic staff is available to answer your questions during regular business hours.  Please don't hesitate to call and ask to speak to one of the nurses for clinical concerns.  If you have a medical emergency, go to the nearest emergency room or call 911.  A surgeon from [facility] is always on call at the hospital   [address], [place], [place]  [address] ?  [address] [address], [place], [place]   [address] [phone] ? [phone] ? FAX [phone] Web site: [email or web address]   Post Anesthesia Home  Care Instructions  Activity: Get plenty of rest for the remainder of the day. A responsible individual must stay with you for 24 hours following the procedure.  For the next 24 hours, DO NOT: -Drive a car -Advertising copywriter -Drink alcoholic beverages -Take any medication unless instructed by your physician -Make any legal decisions or sign important papers.  Meals: Start with liquid foods such as gelatin or soup. Progress to regular foods as tolerated. Avoid greasy, spicy, heavy foods. If nausea and/or vomiting occur, drink only clear liquids until the nausea and/or vomiting subsides. Call your physician if vomiting continues.  Special Instructions/Symptoms: Your throat may feel dry or sore from the anesthesia or the breathing tube placed in your throat during surgery. If this causes discomfort, gargle with warm salt water. The discomfort should disappear within 24 hours.  If you had a scopolamine patch placed behind your ear for the management of post- operative nausea and/or vomiting:  1. The medication in the patch is effective for 72 hours, after which it should be removed.  Wrap patch in a tissue and discard in the trash. Wash hands thoroughly with soap and water. 2. You may remove the patch earlier than 72 hours if you experience unpleasant side effects which may include dry mouth, dizziness or visual disturbances. 3. Avoid touching the patch. Wash your hands with soap and water after contact with the patch.  No tylenol  until after 1:30pm today.  No ibuprofen until after 4pm today.

## 2024-06-16 NOTE — Op Note (Signed)
   Cristian Campos 06/16/2024   Pre-op Diagnosis: LEFT INGUINAL HERNIA     Post-op Diagnosis: same  Procedure(s): OPEN LEFT INGUINAL HERNIA REPAIR WITH MESH  Surgeon(s): Vernetta Berg, MD Margretta Brash, MD [facility] Resident  Anesthesia: General  Staff:  Circulator: Jason Chroman, RN Scrub Person: Wilmon Antonio SQUIBB, RN  Estimated Blood Loss: Minimal               Findings: The patient was found to have an indirect left inguinal  hernia which was repaired with a large piece of Prolene ProGrip mesh from [employer]  Procedure: The patient was brought to the operating room and identified the correct patient.  He was placed upon the operating table and general anesthesia was induced.  The patient had already had a left-sided tap block by anesthesiology.  We anesthetized the old incision in the left inguinal area from his previous hydrocele surgery with Marcaine .  We then made a longitudinal incision with a scalpel.  We then dissected down through Scarpa's fascia with the cautery.  The external oblique fascia was then identified and opened toward the internal and external rings.  The testicular cord and structures were then controlled with Penrose drain.  We then identified an indirect hernia sac.  All the contents have been reduced.  We dissected the sac down to its base at the internal ring.  There was a lipoma of the cord which we reduced back through the internal ring.  We then tied off the base of the sac with a 2-0 silk suture and excised the redundant sac.  Next a large piece of Prolene ProGrip mesh was brought to the field.  The mesh was then placed against the pubic tubercle and the brought around the cord structures overlying the internal ring.  We then sutured the mesh in place to the pubic tubercle and shelving edge of the inguinal ligament with interrupted 2-0 Vicryl sutures.  Wide coverage of the inguinal floor and internal ring appeared to be achieved.  Hemostasis appeared to be  achieved.  We then closed the external oblique fascia over the top of the mesh with a running 2-0 Vicryl suture.  Scarpa's fascia was closed interrupted 3-0 Vicryl sutures and the skin was closed with a running 4-0 Monocryl.  Dermabond was then applied.  The patient tolerated the procedure well.  All the counts were correct at the end of the procedure.  The patient was then extubated in the operating room and taken in a stable condition to the recovery room.          Berg Vernetta   Date: 06/16/2024  Time: 8:07 AM

## 2024-06-16 NOTE — Transfer of Care (Signed)
 Immediate Anesthesia Transfer of Care Note  Patient: Cristian Campos  Procedure(s) Performed: REPAIR, HERNIA, INGUINAL, ADULT (Left)  Patient Location: PACU  Anesthesia Type:General  Level of Consciousness: awake, alert , and oriented  Airway & Oxygen Therapy: Patient Spontanous Breathing and Patient connected to face mask oxygen  Post-op Assessment: Report given to RN and Post -op Vital signs reviewed and stable  Post vital signs: Reviewed and stable  Last Vitals:  Vitals Value Taken Time  BP 122/75 06/16/24 08:20  Temp    Pulse 61 06/16/24 08:22  Resp 13 06/16/24 08:22  SpO2 99 % 06/16/24 08:22  Vitals shown include unfiled device data.  Last Pain:  Vitals:   06/16/24 0641  PainSc: 0-No pain      Patients Stated Pain Goal: 3 (06/16/24 0641)  Complications: No notable events documented.

## 2024-06-16 NOTE — Anesthesia Procedure Notes (Signed)
 Anesthesia Regional Block: TAP block   Pre-Anesthetic Checklist: , timeout performed,  Correct Patient, Correct Site, Correct Laterality,  Correct Procedure, Correct Position, site marked,  Risks and benefits discussed,  Surgical consent,  Pre-op evaluation,  At surgeon's request and post-op pain management  Laterality: Left  Prep: Maximum Sterile Barrier Precautions used, chloraprep       Needles:  Injection technique: Single-shot  Needle Type: Echogenic Stimulator Needle     Needle Length: 9cm  Needle Gauge: 22     Additional Needles:   Procedures:,,,, ultrasound used (permanent image in chart),,    Narrative:  Start time: 06/16/2024 7:05 AM End time: 06/16/2024 7:10 AM Injection made incrementally with aspirations every 5 mL.  Performed by: Personally  Anesthesiologist: Merla Almarie HERO, DO  Additional Notes: Monitors applied. No increased pain on injection. No increased resistance to injection. Injection made in 5cc increments. Good needle visualization. Patient tolerated procedure well.

## 2024-06-16 NOTE — Anesthesia Procedure Notes (Signed)
 Procedure Name: LMA Insertion Date/Time: 06/16/2024 7:29 AM  Performed by: Buster Catheryn SAUNDERS, CRNAPre-anesthesia Checklist: Patient identified, Emergency Drugs available, Suction available and Patient being monitored Patient Re-evaluated:Patient Re-evaluated prior to induction Oxygen Delivery Method: Circle system utilized Preoxygenation: Pre-oxygenation with 100% oxygen Induction Type: IV induction Ventilation: Mask ventilation without difficulty LMA: LMA inserted LMA Size: 4.0 Number of attempts: 1 Placement Confirmation: positive ETCO2 Tube secured with: Tape Dental Injury: Teeth and Oropharynx as per pre-operative assessment

## 2024-06-16 NOTE — Anesthesia Postprocedure Evaluation (Signed)
 Anesthesia Post Note  Patient: [name]  Procedure(s) Performed: REPAIR, HERNIA, INGUINAL, ADULT (Left)     Patient location during evaluation: PACU Anesthesia Type: Regional and General Level of consciousness: awake and alert, oriented and patient cooperative Pain management: pain level controlled Vital Signs Assessment: post-procedure vital signs reviewed and stable Respiratory status: spontaneous breathing, nonlabored ventilation and respiratory function stable Cardiovascular status: blood pressure returned to baseline and stable Postop Assessment: no apparent nausea or vomiting Anesthetic complications: no   No notable events documented.  Last Vitals:  Vitals:   06/16/24 0845 06/16/24 0902  BP: 136/83   Pulse: 61 (!) 59  Resp: 16 16  Temp:  (!) 36.2 C  SpO2: 97% 98%    Last Pain:  Vitals:   06/16/24 0902  PainSc: 2                  Almarie CHRISTELLA Marchi

## 2024-06-16 NOTE — Interval H&P Note (Signed)
 History and Physical Interval Note:no change in H and P  06/16/2024 7:06 AM  Cristian Campos  has presented today for surgery, with the diagnosis of LEFT INGUINAL HERNIA.  The various methods of treatment have been discussed with the patient and family. After consideration of risks, benefits and other options for treatment, the patient has consented to  Procedure(s) with comments: REPAIR, HERNIA, INGUINAL, ADULT (Left) - LMA/TAP BLOCK OPEN LEFT INGUINAL HERNIA REPAIR WITH MESH as a surgical intervention.  The patient's history has been reviewed, patient examined, no change in status, stable for surgery.  I have reviewed the patient's chart and labs.  Questions were answered to the patient's satisfaction.     Cristian Campos

## 2024-06-17 ENCOUNTER — Encounter (HOSPITAL_BASED_OUTPATIENT_CLINIC_OR_DEPARTMENT_OTHER): Payer: Self-pay | Admitting: Surgery
